# Patient Record
Sex: Female | Born: 1980 | Race: White | Hispanic: No | Marital: Married | Smoking: Never smoker
Health system: Southern US, Community
[De-identification: ages and names within clinical notes are randomized; demographics above are authoritative.]

## PROBLEM LIST (undated history)

## (undated) DIAGNOSIS — F419 Anxiety disorder, unspecified: Secondary | ICD-10-CM

## (undated) HISTORY — DX: Anxiety disorder, unspecified: F41.9

---

## 2006-06-28 ENCOUNTER — Inpatient Hospital Stay (HOSPITAL_COMMUNITY): Admission: AD | Admit: 2006-06-28 | Discharge: 2006-06-30 | Payer: Self-pay | Admitting: Obstetrics & Gynecology

## 2006-07-01 ENCOUNTER — Encounter: Admission: RE | Admit: 2006-07-01 | Discharge: 2006-07-31 | Payer: Self-pay | Admitting: Obstetrics and Gynecology

## 2006-07-07 ENCOUNTER — Ambulatory Visit: Admission: RE | Admit: 2006-07-07 | Discharge: 2006-07-07 | Payer: Self-pay | Admitting: Obstetrics and Gynecology

## 2006-08-01 ENCOUNTER — Encounter: Admission: RE | Admit: 2006-08-01 | Discharge: 2006-08-31 | Payer: Self-pay | Admitting: Obstetrics and Gynecology

## 2006-09-01 ENCOUNTER — Encounter: Admission: RE | Admit: 2006-09-01 | Discharge: 2006-09-29 | Payer: Self-pay | Admitting: Obstetrics and Gynecology

## 2006-09-30 ENCOUNTER — Encounter: Admission: RE | Admit: 2006-09-30 | Discharge: 2006-10-30 | Payer: Self-pay | Admitting: Obstetrics and Gynecology

## 2006-10-31 ENCOUNTER — Encounter: Admission: RE | Admit: 2006-10-31 | Discharge: 2006-11-29 | Payer: Self-pay | Admitting: Obstetrics and Gynecology

## 2006-11-30 ENCOUNTER — Encounter: Admission: RE | Admit: 2006-11-30 | Discharge: 2006-12-22 | Payer: Self-pay | Admitting: Obstetrics and Gynecology

## 2007-02-06 LAB — CONVERTED CEMR LAB

## 2008-01-13 ENCOUNTER — Ambulatory Visit: Payer: Self-pay | Admitting: Internal Medicine

## 2008-01-13 DIAGNOSIS — J309 Allergic rhinitis, unspecified: Secondary | ICD-10-CM | POA: Insufficient documentation

## 2008-02-05 ENCOUNTER — Encounter: Payer: Self-pay | Admitting: Internal Medicine

## 2009-11-13 ENCOUNTER — Inpatient Hospital Stay (HOSPITAL_COMMUNITY): Admission: AD | Admit: 2009-11-13 | Discharge: 2009-11-13 | Payer: Self-pay | Admitting: Obstetrics and Gynecology

## 2009-12-08 ENCOUNTER — Inpatient Hospital Stay (HOSPITAL_COMMUNITY): Admission: AD | Admit: 2009-12-08 | Discharge: 2009-12-08 | Payer: Self-pay | Admitting: Obstetrics and Gynecology

## 2009-12-13 ENCOUNTER — Inpatient Hospital Stay (HOSPITAL_COMMUNITY): Admission: AD | Admit: 2009-12-13 | Discharge: 2009-12-15 | Payer: Self-pay | Admitting: Obstetrics and Gynecology

## 2010-09-24 LAB — CBC
Hemoglobin: 10.8 g/dL — ABNORMAL LOW (ref 12.0–15.0)
Hemoglobin: 12.5 g/dL (ref 12.0–15.0)
Platelets: 188 10*3/uL (ref 150–400)
RBC: 3.26 MIL/uL — ABNORMAL LOW (ref 3.87–5.11)
RDW: 12.8 % (ref 11.5–15.5)
RDW: 13 % (ref 11.5–15.5)
WBC: 14 10*3/uL — ABNORMAL HIGH (ref 4.0–10.5)

## 2010-09-24 LAB — URINALYSIS, ROUTINE W REFLEX MICROSCOPIC
Bilirubin Urine: NEGATIVE
Glucose, UA: NEGATIVE mg/dL
Hgb urine dipstick: NEGATIVE
Ketones, ur: NEGATIVE mg/dL
Nitrite: NEGATIVE
Protein, ur: NEGATIVE mg/dL
Specific Gravity, Urine: >1.03 — ABNORMAL HIGH (ref 1.005–1.030)
pH: 5.5 (ref 5.0–8.0)

## 2010-09-25 LAB — URINALYSIS, ROUTINE W REFLEX MICROSCOPIC
Ketones, ur: NEGATIVE mg/dL
Nitrite: NEGATIVE

## 2011-03-14 ENCOUNTER — Ambulatory Visit: Payer: Managed Care, Other (non HMO) | Admitting: Internal Medicine

## 2011-04-19 DIAGNOSIS — F411 Generalized anxiety disorder: Secondary | ICD-10-CM | POA: Insufficient documentation

## 2011-08-08 IMAGING — US US FETAL BPP W/O NONSTRESS
1 series · 5 of 5 positions shown · non-contrast
Comparison: none

OBSTETRICAL ULTRASOUND:
 This ultrasound exam was performed in the [HOSPITAL] Ultrasound Department.  The OB US report was generated in the AS system, and faxed to the ordering physician.  This report is also available in [HOSPITAL]?s Access[employer] and in [REDACTED] PACS.

[Series 1: us fetal bpp w/o nonstress · non-contrast · 0.27mm/px · 5 acquisitions, 5 frames shown]
[im 1/5]
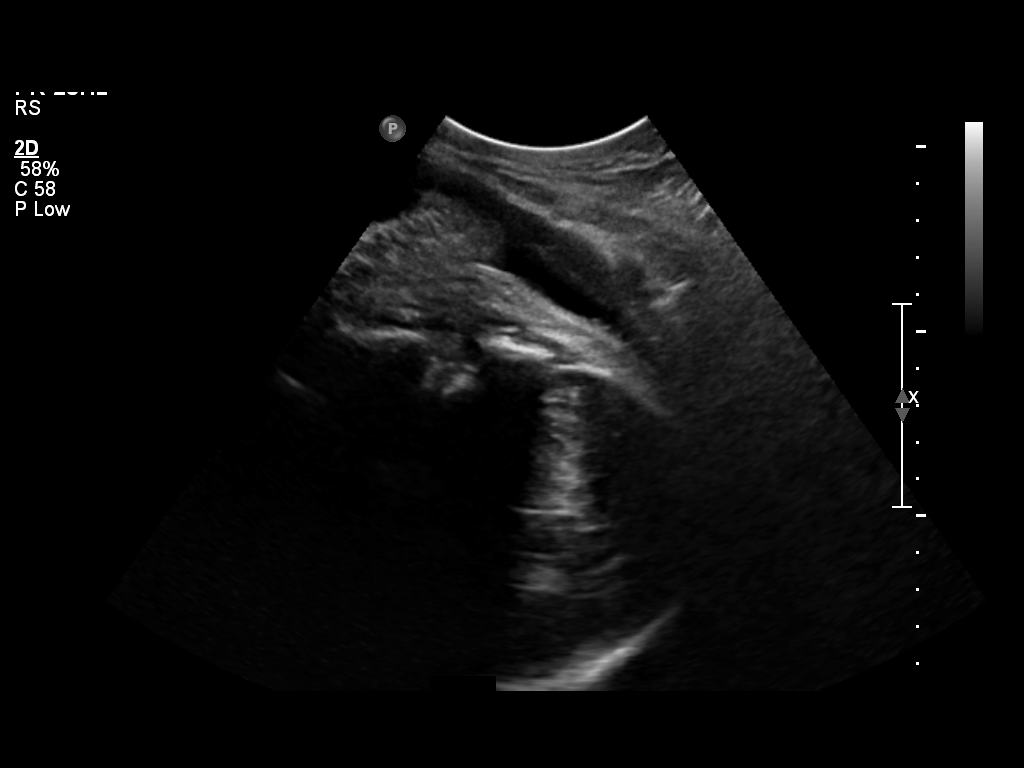
[im 2/5]
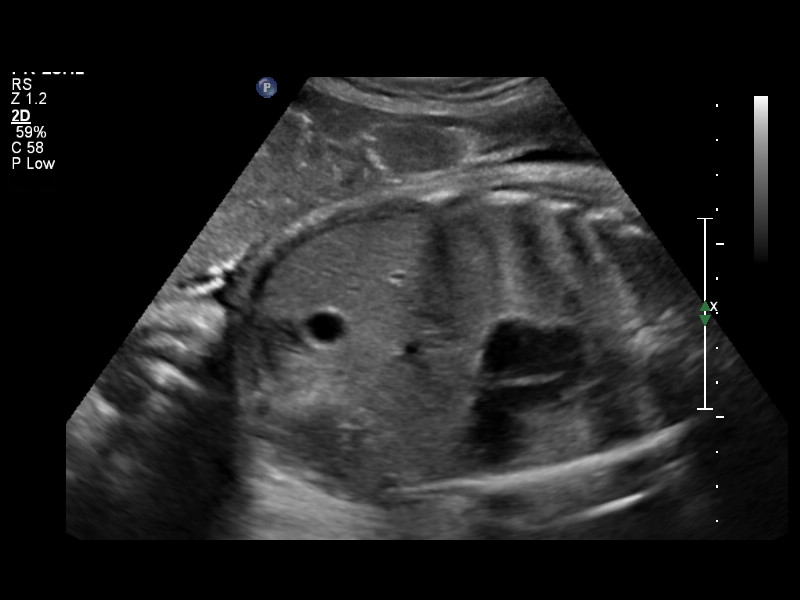
[im 3/5]
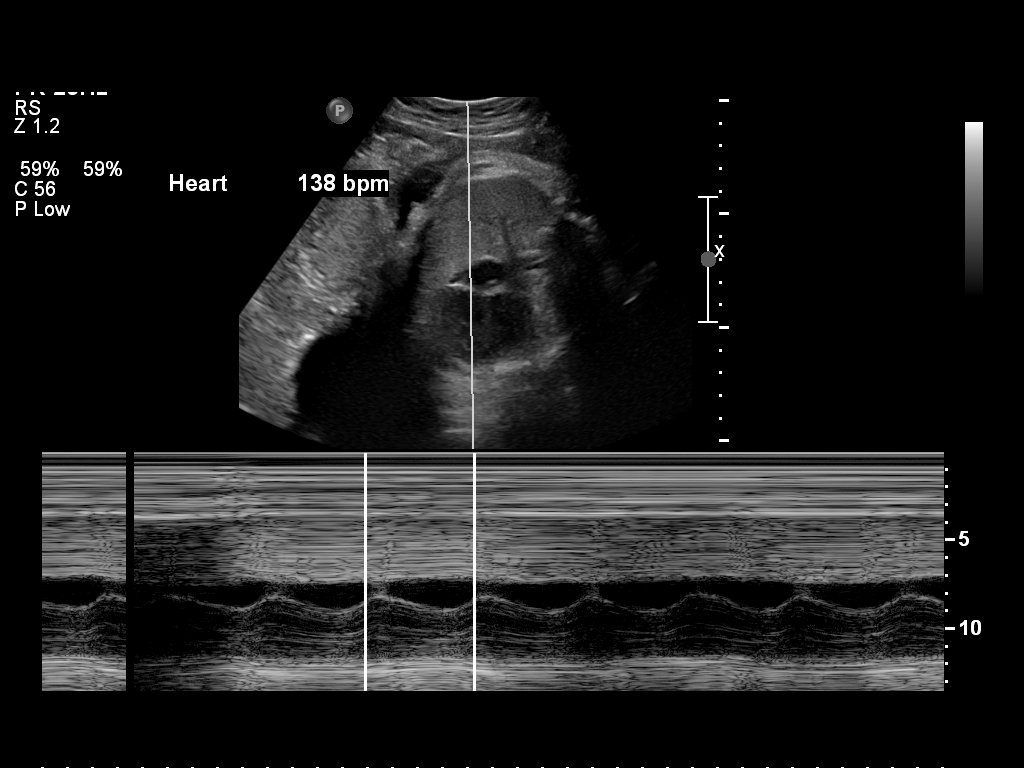
[im 4/5]
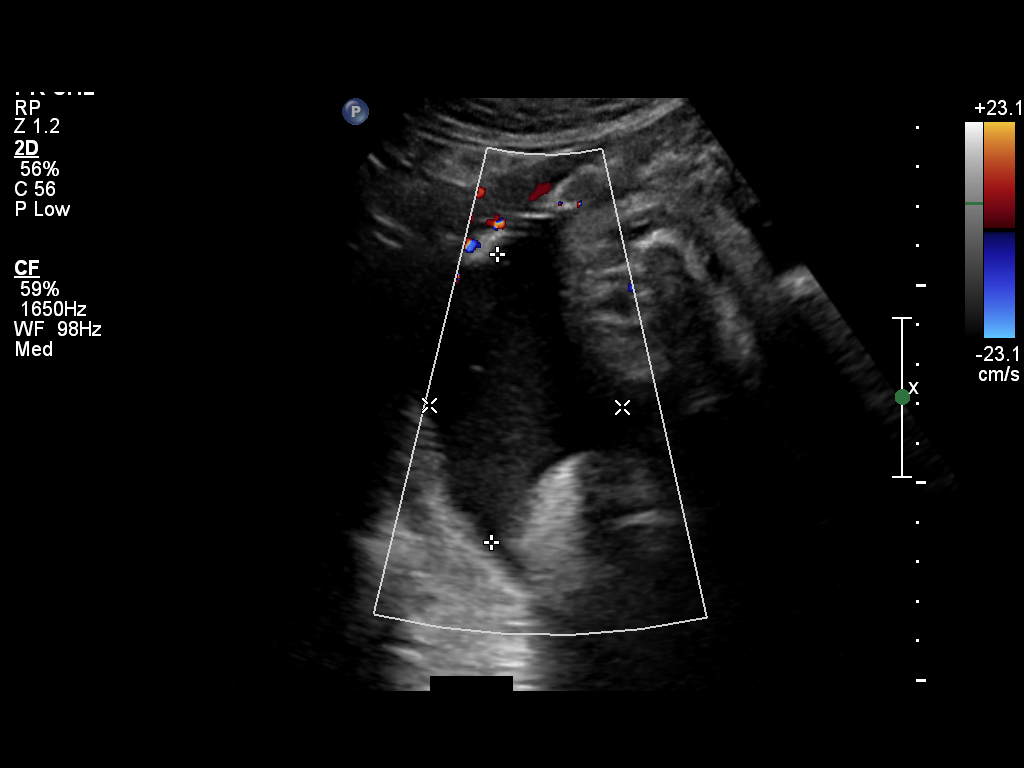
[im 5/5]
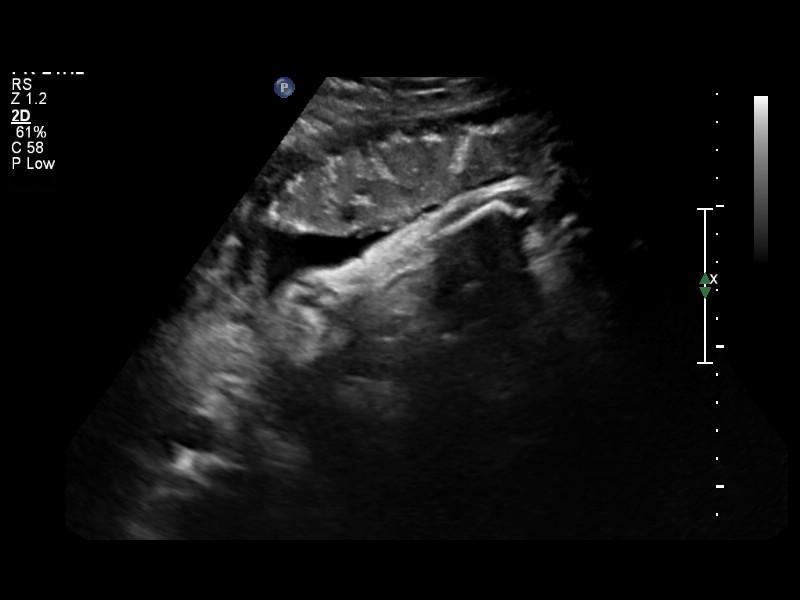

[5 of 5 positions shown; findings below may reference images not displayed]

IMPRESSION: See [facility] Obstetric US report.

## 2013-06-14 ENCOUNTER — Ambulatory Visit (INDEPENDENT_AMBULATORY_CARE_PROVIDER_SITE_OTHER): Payer: Managed Care, Other (non HMO) | Admitting: Obstetrics and Gynecology

## 2013-06-14 ENCOUNTER — Encounter: Payer: Self-pay | Admitting: Obstetrics and Gynecology

## 2013-06-14 VITALS — BP 120/74 | HR 80 | Resp 16 | Ht 67.0 in | Wt 152.5 lb

## 2013-06-14 DIAGNOSIS — Z0000 Encounter for general adult medical examination without abnormal findings: Secondary | ICD-10-CM

## 2013-06-14 DIAGNOSIS — Z23 Encounter for immunization: Secondary | ICD-10-CM

## 2013-06-14 DIAGNOSIS — Z01419 Encounter for gynecological examination (general) (routine) without abnormal findings: Secondary | ICD-10-CM

## 2013-06-14 LAB — POCT URINALYSIS DIPSTICK
Blood, UA: NEGATIVE
Glucose, UA: NEGATIVE
Leukocytes, UA: NEGATIVE
Nitrite, UA: NEGATIVE
Protein, UA: NEGATIVE
pH, UA: 5

## 2013-06-14 LAB — HEMOGLOBIN, FINGERSTICK: Hemoglobin, fingerstick: 14.7 g/dL (ref 12.0–16.0)

## 2013-06-14 NOTE — Patient Instructions (Signed)
EXERCISE AND DIET:  We recommended that you start or continue a regular exercise program for good health. Regular exercise means any activity that makes your heart beat faster and makes you sweat.  We recommend exercising at least 30 minutes per day at least 3 days a week, preferably 4 or 5.  We also recommend a diet low in fat and sugar.  Inactivity, poor dietary choices and obesity can cause diabetes, heart attack, stroke, and kidney damage, among others.    ALCOHOL AND SMOKING:  Women should limit their alcohol intake to no more than 7 drinks/beers/glasses of wine (combined, not each!) per week. Moderation of alcohol intake to this level decreases your risk of breast cancer and liver damage. And of course, no recreational drugs are part of a healthy lifestyle.  And absolutely no smoking or even second hand smoke. Most people know smoking can cause heart and lung diseases, but did you know it also contributes to weakening of your bones? Aging of your skin?  Yellowing of your teeth and nails?  CALCIUM AND VITAMIN D:  Adequate intake of calcium and Vitamin D are recommended.  The recommendations for exact amounts of these supplements seem to change often, but generally speaking 600 mg of calcium (either carbonate or citrate) and 800 units of Vitamin D per day seems prudent. Certain women may benefit from higher intake of Vitamin D.  If you are among these women, your doctor will have told you during your visit.    PAP SMEARS:  Pap smears, to check for cervical cancer or precancers,  have traditionally been done yearly, although recent scientific advances have shown that most women can have pap smears less often.  However, every woman still should have a physical exam from her gynecologist every year. It will include a breast check, inspection of the vulva and vagina to check for abnormal growths or skin changes, a visual exam of the cervix, and then an exam to evaluate the size and shape of the uterus and  ovaries.  And after 30 years of age, a rectal exam is indicated to check for rectal cancers. We will also provide age appropriate advice regarding health maintenance, like when you should have certain vaccines, screening for sexually transmitted diseases, bone density testing, colonoscopy, mammograms, etc.   MAMMOGRAMS:  All women over 40 years old should have a yearly mammogram. Many facilities now offer a "3D" mammogram, which may cost around $50 extra out of pocket. If possible,  we recommend you accept the option to have the 3D mammogram performed.  It both reduces the number of women who will be called back for extra views which then turn out to be normal, and it is better than the routine mammogram at detecting truly abnormal areas.    COLONOSCOPY:  Colonoscopy to screen for colon cancer is recommended for all women at age 50.  We know, you hate the idea of the prep.  We agree, BUT, having colon cancer and not knowing it is worse!!  Colon cancer so often starts as a polyp that can be seen and removed at colonscopy, which can quite literally save your life!  And if your first colonoscopy is normal and you have no family history of colon cancer, most women don't have to have it again for 10 years.  Once every ten years, you can do something that may end up saving your life, right?  We will be happy to help you get it scheduled when you are ready.    Be sure to check your insurance coverage so you understand how much it will cost.  It may be covered as a preventative service at no cost, but you should check your particular policy.     Tetanus, Diphtheria, Pertussis (Tdap) Vaccine What You Need to Know WHY GET VACCINATED? Tetanus, diphtheria and pertussis can be very serious diseases, even for adolescents and adults. Tdap vaccine can protect us from these diseases. TETANUS (Lockjaw) causes painful muscle tightening and stiffness, usually all over the body.  It can lead to tightening of muscles in the  head and neck so you can't open your mouth, swallow, or sometimes even breathe. Tetanus kills about 1 out of 5 people who are infected. DIPHTHERIA can cause a thick coating to form in the back of the throat.  It can lead to breathing problems, paralysis, heart failure, and death. PERTUSSIS (Whooping Cough) causes severe coughing spells, which can cause difficulty breathing, vomiting and disturbed sleep.  It can also lead to weight loss, incontinence, and rib fractures. Up to 2 in 100 adolescents and 5 in 100 adults with pertussis are hospitalized or have complications, which could include pneumonia and death. These diseases are caused by bacteria. Diphtheria and pertussis are spread from person to person through coughing or sneezing. Tetanus enters the body through cuts, scratches, or wounds. Before vaccines, the [place] saw as many as 200,000 cases a year of diphtheria and pertussis, and hundreds of cases of tetanus. Since vaccination began, tetanus and diphtheria have dropped by about 99% and pertussis by about 80%. TDAP VACCINE Tdap vaccine can protect adolescents and adults from tetanus, diphtheria, and pertussis. One dose of Tdap is routinely given at age 11 or 12. People who did not get Tdap at that age should get it as soon as possible. Tdap is especially important for health care professionals and anyone having close contact with a baby younger than 12 months. Pregnant women should get a dose of Tdap during every pregnancy, to protect the newborn from pertussis. Infants are most at risk for severe, life-threatening complications from pertussis. A similar vaccine, called Td, protects from tetanus and diphtheria, but not pertussis. A Td booster should be given every 10 years. Tdap may be given as one of these boosters if you have not already gotten a dose. Tdap may also be given after a severe cut or burn to prevent tetanus infection. Your doctor can give you more information. Tdap may  safely be given at the same time as other vaccines. SOME PEOPLE SHOULD NOT GET THIS VACCINE  If you ever had a life-threatening allergic reaction after a dose of any tetanus, diphtheria, or pertussis containing vaccine, OR if you have a severe allergy to any part of this vaccine, you should not get Tdap. Tell your doctor if you have any severe allergies.  If you had a coma, or long or multiple seizures within 7 days after a childhood dose of DTP or DTaP, you should not get Tdap, unless a cause other than the vaccine was found. You can still get Td.  Talk to your doctor if you:  have epilepsy or another nervous system problem,  had severe pain or swelling after any vaccine containing diphtheria, tetanus or pertussis,  ever had Guillain-Barr Syndrome (GBS),  aren't feeling well on the day the shot is scheduled. RISKS OF A VACCINE REACTION With any medicine, including vaccines, there is a chance of side effects. These are usually mild and go away on their own, but   serious reactions are also possible. Brief fainting spells can follow a vaccination, leading to injuries from falling. Sitting or lying down for about 15 minutes can help prevent these. Tell your doctor if you feel dizzy or light-headed, or have vision changes or ringing in the ears. Mild problems following Tdap (Did not interfere with activities)  Pain where the shot was given (about 3 in 4 adolescents or 2 in 3 adults)  Redness or swelling where the shot was given (about 1 person in 5)  Mild fever of at least 100.4F (up to about 1 in 25 adolescents or 1 in 100 adults)  Headache (about 3 or 4 people in 10)  Tiredness (about 1 person in 3 or 4)  Nausea, vomiting, diarrhea, stomach ache (up to 1 in 4 adolescents or 1 in 10 adults)  Chills, body aches, sore joints, rash, swollen glands (uncommon) Moderate problems following Tdap (Interfered with activities, but did not require medical attention)  Pain where the shot was  given (about 1 in 5 adolescents or 1 in 100 adults)  Redness or swelling where the shot was given (up to about 1 in 16 adolescents or 1 in 25 adults)  Fever over 102F (about 1 in 100 adolescents or 1 in 250 adults)  Headache (about 3 in 20 adolescents or 1 in 10 adults)  Nausea, vomiting, diarrhea, stomach ache (up to 1 or 3 people in 100)  Swelling of the entire arm where the shot was given (up to about 3 in 100). Severe problems following Tdap (Unable to perform usual activities, required medical attention)  Swelling, severe pain, bleeding and redness in the arm where the shot was given (rare). A severe allergic reaction could occur after any vaccine (estimated less than 1 in a million doses). WHAT IF THERE IS A SERIOUS REACTION? What should I look for?  Look for anything that concerns you, such as signs of a severe allergic reaction, very high fever, or behavior changes. Signs of a severe allergic reaction can include hives, swelling of the face and throat, difficulty breathing, a fast heartbeat, dizziness, and weakness. These would start a few minutes to a few hours after the vaccination. What should I do?  If you think it is a severe allergic reaction or other emergency that can't wait, call 9-1-1 or get the person to the nearest hospital. Otherwise, call your doctor.  Afterward, the reaction should be reported to the "Vaccine Adverse Event Reporting System" (VAERS). Your doctor might file this report, or you can do it yourself through the VAERS web site at www.vaers.hhs.gov, or by calling 1-800-822-7967. VAERS is only for reporting reactions. They do not give medical advice.  THE NATIONAL VACCINE INJURY COMPENSATION PROGRAM The National Vaccine Injury Compensation Program (VICP) is a federal program that was created to compensate people who may have been injured by certain vaccines. Persons who believe they may have been injured by a vaccine can learn about the program and about  filing a claim by calling 1-800-338-2382 or visiting the VICP website at www.hrsa.gov/vaccinecompensation. HOW CAN I LEARN MORE?  Ask your doctor.  Call your local or state health department.  Contact the Centers for Disease Control and Prevention (CDC):  Call 1-800-232-4636 or visit CDC's website at www.cdc.gov/vaccines. CDC Tdap Vaccine VIS (11/14/11) Document Released: 12/24/2011 Document Revised: 10/19/2012 Document Reviewed: 10/14/2012 Exit[employer] Patient Information 2014 [employer].  

## 2013-06-14 NOTE — Progress Notes (Signed)
Patient ID: Victoria Nash, female   DOB: [birth year], 32 y.o.   MRN: [id number] GYNECOLOGY VISIT  PCP:   None  Referring provider:   HPI: 32 y.o.   Married  Caucasian  female   G2P2002 with Patient's last menstrual period was 05/31/2013.   here for   AEX. Normal menses.  Hgb:    14.7 Urine:  Neg  GYNECOLOGIC HISTORY: Patient's last menstrual period was 05/31/2013. Sexually active:  yes Partner preference: female Contraception:   vasectomy Menopausal hormone therapy: n/a DES exposure:   no Blood transfusions:   no Sexually transmitted diseases:  no  GYN Procedures:  none Mammogram:   n/a              Pap:   05/2012 wnl History of abnormal pap smear:  no   OB History   Grav Para Term Preterm Abortions TAB SAB Ect Mult Living   2 2 2       2        LIFESTYLE: Exercise:    no           Tobacco:   no Alcohol:      2-4 glasses of wine per week Drug use:   no  OTHER HEALTH MAINTENANCE: Tetanus/TDap:   10 years ago or greater Gardisil:               no Influenza:             Not usually Zostavax:             no  Bone density:      n/a Colonoscopy:      n/a  Cholesterol check:  06/2012 wnl  Family History  Problem Relation Age of Onset  . Hypertension Father     Patient Active Problem List   Diagnosis Date Noted  . ALLERGIC RHINITIS 01/13/2008   History reviewed. No pertinent past medical history.  History reviewed. No pertinent past surgical history.  ALLERGIES: Review of patient's allergies indicates no known allergies.  No current outpatient prescriptions on file.   No current facility-administered medications for this visit.     ROS:  Pertinent items are noted in HPI.  SOCIAL HISTORY:  Works in Occupational psychologist.  Children 67 and 30 years old.   PHYSICAL EXAMINATION:    BP 120/74  Pulse 80  Resp 16  Ht 5\' 7"  (1.702 m)  Wt 152 lb 8 oz (69.174 kg)  BMI 23.88 kg/m2  LMP 05/31/2013   Wt Readings from Last 3 Encounters:  06/14/13 152 lb 8 oz (69.174  kg)  01/13/08 139 lb 8 oz (63.277 kg)     Ht Readings from Last 3 Encounters:  06/14/13 5\' 7"  (1.702 m)  01/13/08 5\' 7"  (1.702 m)    General appearance: alert, cooperative and appears stated age Head: Normocephalic, without obvious abnormality, atraumatic Neck: no adenopathy, supple, symmetrical, trachea midline and thyroid not enlarged, symmetric, no tenderness/mass/nodules Lungs: clear to auscultation bilaterally Breasts: Inspection negative, No nipple retraction or dimpling, No nipple discharge or bleeding, No axillary or supraclavicular adenopathy, Normal to palpation without dominant masses Heart: regular rate and rhythm Abdomen: soft, non-tender; no masses,  no organomegaly Extremities: extremities normal, atraumatic, no cyanosis or edema Skin: Skin color, texture, turgor normal. No rashes or lesions Lymph nodes: Cervical, supraclavicular, and axillary nodes normal. No abnormal inguinal nodes palpated Neurologic: Grossly normal  Pelvic: External genitalia:  no lesions  Urethra:  normal appearing urethra with no masses, tenderness or lesions              Bartholins and Skenes: normal                 Vagina: normal appearing vagina with normal color and discharge, no lesions              Cervix: normal appearance              Pap and high risk HPV testing done: yes.            Bimanual Exam:  Uterus:  uterus is normal size, shape, consistency and nontender                                      Adnexa: normal adnexa in size, nontender and no masses                                        ASSESSMENT  Normal gynecologic exam.  PLAN  Mammogram age 73 years old.  Pap smear and high risk HPV testing performed.  Counseled on vitamins, calcium TDap today. Flu vaccine at local pharmacy.  Return annually or prn   An After Visit Summary was printed and given to the patient.

## 2013-06-16 LAB — IPS PAP TEST WITH HPV

## 2014-05-09 ENCOUNTER — Encounter: Payer: Self-pay | Admitting: Obstetrics and Gynecology

## 2014-06-16 ENCOUNTER — Ambulatory Visit: Payer: Managed Care, Other (non HMO) | Admitting: Obstetrics and Gynecology

## 2014-06-16 ENCOUNTER — Telehealth: Payer: Self-pay | Admitting: Obstetrics and Gynecology

## 2014-06-16 NOTE — Telephone Encounter (Signed)
Patient called to cancel her AEX today with Dr.Silva due to her child care "falling through." No missed appointment fee charged (confirmed with Sue LushAndrea). Patient rescheduled with Leota Sauerseborah Leonard, CNM for 06/20/14. FYI only.

## 2014-06-16 NOTE — Telephone Encounter (Signed)
Thank you for the update!

## 2014-06-20 ENCOUNTER — Ambulatory Visit (INDEPENDENT_AMBULATORY_CARE_PROVIDER_SITE_OTHER): Payer: Managed Care, Other (non HMO) | Admitting: Certified Nurse Midwife

## 2014-06-20 ENCOUNTER — Encounter: Payer: Self-pay | Admitting: Certified Nurse Midwife

## 2014-06-20 VITALS — BP 120/80 | HR 72 | Resp 16 | Ht 66.75 in | Wt 139.0 lb

## 2014-06-20 DIAGNOSIS — Z0000 Encounter for general adult medical examination without abnormal findings: Secondary | ICD-10-CM

## 2014-06-20 DIAGNOSIS — Z01419 Encounter for gynecological examination (general) (routine) without abnormal findings: Secondary | ICD-10-CM

## 2014-06-20 LAB — POCT URINALYSIS DIPSTICK
Bilirubin, UA: NEGATIVE
Blood, UA: NEGATIVE
Glucose, UA: NEGATIVE
KETONES UA: NEGATIVE
LEUKOCYTES UA: NEGATIVE
Nitrite, UA: NEGATIVE
PROTEIN UA: NEGATIVE
UROBILINOGEN UA: NEGATIVE
pH, UA: 5

## 2014-06-20 LAB — HEMOGLOBIN, FINGERSTICK: Hemoglobin, fingerstick: 13.7 g/dL (ref 12.0–16.0)

## 2014-06-20 NOTE — Patient Instructions (Signed)

## 2014-06-20 NOTE — Progress Notes (Signed)
33 y.o. 562P2002 Married Caucasian Fe here for annual exam.  Periods normal, no issues. Sees urgent care if needed. Has been exercising  To lose weight with 13 pound weight loss. No health issues today.  Patient's last menstrual period was 05/27/2014.          Sexually active: Yes.    The current method of family planning is vasectomy.    Exercising: Yes.    cycle, strength training Smoker:  no  Health Maintenance: Pap:  06-14-13 neg HPV HR neg No abnormal pap history MMG:  none Colonoscopy:  none BMD:   none TDaP: 2014 Labs: Poct urine-neg Self breast exam: done monthly   reports that she has never smoked. She does not have any smokeless tobacco history on file. She reports that she drinks about 1.8 - 2.4 oz of alcohol per week. She reports that she does not use illicit drugs.  History reviewed. No pertinent past medical history.  History reviewed. No pertinent past surgical history.  No current outpatient prescriptions on file.   No current facility-administered medications for this visit.    Family History  Problem Relation Age of Onset  . Hypertension Father     ROS:  Pertinent items are noted in HPI.  Otherwise, a comprehensive ROS was negative.  Exam:   BP 120/80 mmHg  Pulse 72  Resp 16  Ht 5' 6.75" (1.695 m)  Wt 139 lb (63.05 kg)  BMI 21.95 kg/m2  LMP 05/27/2014 Height: 5' 6.75" (169.5 cm)  Ht Readings from Last 3 Encounters:  06/20/14 5' 6.75" (1.695 m)  06/14/13 5\' 7"  (1.702 m)  01/13/08 5\' 7"  (1.702 m)    General appearance: alert, cooperative and appears stated age Head: Normocephalic, without obvious abnormality, atraumatic Neck: no adenopathy, supple, symmetrical, trachea midline and thyroid normal to inspection and palpation Lungs: clear to auscultation bilaterally Breasts: normal appearance, no masses or tenderness, No nipple retraction or dimpling, No nipple discharge or bleeding Heart: regular rate and rhythm Abdomen: soft, non-tender; no masses,   no organomegaly Extremities: extremities normal, atraumatic, no cyanosis or edema Skin: Skin color, texture, turgor normal. No rashes or lesions Lymph nodes: Cervical, supraclavicular, and axillary nodes normal. No abnormal inguinal nodes palpated Neurologic: Grossly normal   Pelvic: External genitalia:  no lesions              Urethra:  normal appearing urethra with no masses, tenderness or lesions              Bartholin's and Skene's: normal                 Vagina: normal appearing vagina with normal color and discharge, no lesions              Cervix: normal, non tender, no lesions              Pap taken: No. Bimanual Exam:  Uterus:  normal size, contour, position, consistency, mobility, non-tender and anteverted              Adnexa: normal adnexa and no mass, fullness, tenderness               Rectovaginal: Confirms               Anus:  normal sphincter tone, no lesions  A:  Well Woman with normal exam  Screening labs  P:   Reviewed health and wellness pertinent to exam  Lab: Lipid panel  Pap smear not taken today  counseled on breast self exam, adequate intake of calcium and vitamin D, diet and exercise  return annually or prn  An After Visit Summary was printed and given to the patient.

## 2014-06-21 LAB — LIPID PANEL
CHOLESTEROL: 154 mg/dL (ref 0–200)
HDL: 76 mg/dL (ref >39)
LDL CALC: 69 mg/dL (ref 0–99)
Total CHOL/HDL Ratio: 2 Ratio
Triglycerides: 45 mg/dL (ref <150)
VLDL: 9 mg/dL (ref 0–40)

## 2014-06-22 NOTE — Progress Notes (Signed)
Reviewed personally.  M. Suzanne Scottlyn Mchaney, MD.  

## 2015-02-22 ENCOUNTER — Encounter: Payer: Self-pay | Admitting: Podiatry

## 2015-02-22 ENCOUNTER — Ambulatory Visit (INDEPENDENT_AMBULATORY_CARE_PROVIDER_SITE_OTHER): Payer: 59 | Admitting: Podiatry

## 2015-02-22 VITALS — BP 114/78 | HR 80 | Resp 16

## 2015-02-22 DIAGNOSIS — B351 Tinea unguium: Secondary | ICD-10-CM

## 2015-02-22 MED ORDER — TERBINAFINE HCL 250 MG PO TABS: 250.0000 mg | ORAL_TABLET | Freq: Every day | ORAL | Status: DC

## 2015-02-22 NOTE — Progress Notes (Signed)
Subjective:     Patient ID: Victoria Nash, female   DOB: June 20, 1981, 34 y.o.   MRN: [id number]  HPI patient presents with discoloration in thickness of the big toenails and fifth toenails of both feet. States that she's tried medicine and it's been present for at least a year   Review of Systems  All other systems reviewed and are negative.      Objective:   Physical Exam  Constitutional: She is oriented to person, place, and time.  Cardiovascular: Intact distal pulses.   Musculoskeletal: Normal range of motion.  Neurological: She is oriented to person, place, and time.  Skin: Skin is warm.  Nursing note and vitals reviewed.  neurovascular status is intact muscle strength was adequate with range of motion subtalar midtarsal joint within normal limits. 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 At this point I have recommended a combined approach due to for nail involvement consisting of oral Lamisil No. 91 per day for 3 months and also topical and laser. Educated her on this treatment plan and she will get started in the next several weeks and we will get liver function studies done prior to Lamisil initiation

## 2015-02-22 NOTE — Progress Notes (Signed)
   Subjective:    Patient ID: Victoria Nash, female    DOB: 1980-08-29, 34 y.o.   MRN: [id number]  HPI  Pt presents with bilateral discolored nails  Review of Systems  All other systems reviewed and are negative.      Objective:   Physical Exam        Assessment & Plan:

## 2015-02-24 ENCOUNTER — Telehealth: Payer: Self-pay | Admitting: *Deleted

## 2015-02-24 NOTE — Telephone Encounter (Addendum)
Faxed Hepatic function and CBC from Dr. Maryelizabeth Rowan [facility] received for Dr. Charlsie Merles to evaluate for Lamisil treatment.  Dr. Charlsie Merles states labs are within normal limits and pt can take the Lamisil as directed.  I left a message informing pt that Dr. Charlsie Merles stated she could take her medication as directed.  See scanned labs dated 02/22/2105.

## 2015-02-27 DIAGNOSIS — M79673 Pain in unspecified foot: Secondary | ICD-10-CM

## 2015-03-02 ENCOUNTER — Ambulatory Visit: Payer: 59 | Admitting: Podiatry

## 2015-07-11 ENCOUNTER — Ambulatory Visit: Payer: Managed Care, Other (non HMO) | Admitting: Certified Nurse Midwife

## 2015-07-19 ENCOUNTER — Ambulatory Visit (INDEPENDENT_AMBULATORY_CARE_PROVIDER_SITE_OTHER): Payer: 59 | Admitting: Certified Nurse Midwife

## 2015-07-19 ENCOUNTER — Encounter: Payer: Self-pay | Admitting: Certified Nurse Midwife

## 2015-07-19 VITALS — BP 106/64 | HR 72 | Resp 16 | Ht 66.25 in | Wt 156.0 lb

## 2015-07-19 DIAGNOSIS — Z124 Encounter for screening for malignant neoplasm of cervix: Secondary | ICD-10-CM | POA: Diagnosis not present

## 2015-07-19 DIAGNOSIS — Z01419 Encounter for gynecological examination (general) (routine) without abnormal findings: Secondary | ICD-10-CM

## 2015-07-19 NOTE — Progress Notes (Signed)
35 y.o. G18P2002 Married  Caucasian Fe here for annual exam.  Periods normal,no issues. Patient aware she has changed weight due to decrease in cycling. Plans to start back with routine exercise soon. Sees PCP Dr. Duanne Guess yearly for aex/labs, all normal per patient. No other health issues today.  Patient's last menstrual period was 07/10/2015.          Sexually active: Yes.    The current method of family planning is vasectomy.    Exercising: Yes.    cycle & strength Smoker:  no  Health Maintenance: Pap:  06-14-13 neg HPV HR neg MMG:  none Colonoscopy:  none BMD:   none TDaP:  2014 Shingles: no Pneumonia: no Hep C and HIV: never done  Labs: none Self breast exam: done monthly   reports that she has never smoked. She does not have any smokeless tobacco history on file. She reports that she drinks about 2.4 oz of alcohol per week. She reports that she does not use illicit drugs.  History reviewed. No pertinent past medical history.  History reviewed. No pertinent past surgical history.  No current outpatient prescriptions on file.   No current facility-administered medications for this visit.    Family History  Problem Relation Age of Onset  . Hypertension Father   . Cancer Father     oral cancer  . Lung cancer Maternal Grandmother     ROS:  Pertinent items are noted in HPI.  Otherwise, a comprehensive ROS was negative.  Exam:   BP 106/64 mmHg  Pulse 72  Resp 16  Ht 5' 6.25" (1.683 m)  Wt 156 lb (70.761 kg)  BMI 24.98 kg/m2  LMP 07/10/2015 Height: 5' 6.25" (168.3 cm) Ht Readings from Last 3 Encounters:  07/19/15 5' 6.25" (1.683 m)  06/20/14 5' 6.75" (1.695 m)  06/14/13 5\' 7"  (1.702 m)    General appearance: alert, cooperative and appears stated age Head: Normocephalic, without obvious abnormality, atraumatic Neck: no adenopathy, supple, symmetrical, trachea midline and thyroid normal to inspection and palpation Lungs: clear to auscultation bilaterally Breasts:  normal appearance, no masses or tenderness, No nipple retraction or dimpling, No nipple discharge or bleeding, No axillary or supraclavicular adenopathy Heart: regular rate and rhythm Abdomen: soft, non-tender; no masses,  no organomegaly Extremities: extremities normal, atraumatic, no cyanosis or edema Skin: Skin color, texture, turgor normal. No rashes or lesions Lymph nodes: Cervical, supraclavicular, and axillary nodes normal. No abnormal inguinal nodes palpated Neurologic: Grossly normal   Pelvic: External genitalia:  no lesions              Urethra:  normal appearing urethra with no masses, tenderness or lesions              Bartholin's and Skene's: normal                 Vagina: normal appearing vagina with normal color and discharge, no lesions              Cervix: normal,non tender,no lesions              Pap taken: Yes.   Bimanual Exam:  Uterus:  normal size, contour, position, consistency, mobility, non-tender              Adnexa: normal adnexa and no mass, fullness, tenderness               Rectovaginal: Confirms               Anus:  normal sphincter tone, no lesions  Chaperone present: yes  A:  Well Woman with normal exam  Contraception spouse vascetomy  P:   Reviewed health and wellness pertinent to exam  Encouraged to have HIV and Hep. C done with routine labs with PCP  Pap smear as above with HPV reflex   counseled on breast self exam, adequate intake of calcium and vitamin D, diet and exercise  return annually or prn  An After Visit Summary was printed and given to the patient.

## 2015-07-19 NOTE — Patient Instructions (Signed)

## 2015-07-20 NOTE — Progress Notes (Signed)
Reviewed personally.  M. Suzanne Maleiyah Releford, MD.  

## 2015-07-21 LAB — IPS PAP TEST WITH REFLEX TO HPV

## 2016-07-30 ENCOUNTER — Ambulatory Visit: Payer: 59 | Admitting: Certified Nurse Midwife

## 2016-12-19 ENCOUNTER — Encounter: Payer: Self-pay | Admitting: Certified Nurse Midwife

## 2016-12-19 ENCOUNTER — Ambulatory Visit (INDEPENDENT_AMBULATORY_CARE_PROVIDER_SITE_OTHER): Payer: 59 | Admitting: Certified Nurse Midwife

## 2016-12-19 VITALS — BP 120/80 | HR 70 | Resp 16 | Ht 66.25 in | Wt 152.0 lb

## 2016-12-19 DIAGNOSIS — Z01419 Encounter for gynecological examination (general) (routine) without abnormal findings: Secondary | ICD-10-CM | POA: Diagnosis not present

## 2016-12-19 NOTE — Progress Notes (Signed)
36 y.o. 242P2002 Married  Caucasian Fe here for annual exam. Periods regular no change. No missed periods. Father passed last year from Leukemia. Emotionally doing ok. Seeing dermatology again for cystic adult acne and treating . Was better with OCP use.Considering use for acne, does not need for contraception. Sees PCP for aex and labs. No health issues today.  Patient's last menstrual period was 11/24/2016 (exact date).          Sexually active: Yes.    The current method of family planning is vasectomy.    Exercising: Yes.    run,cycle, weights Smoker:  no  Health Maintenance: Pap:  06-14-13 neg HPV HR neg, 07-19-15 neg History of Abnormal Pap: no MMG:  none Self Breast exams: yes Colonoscopy:  none BMD:   none TDaP:  2014 Shingles: no Pneumonia: no Hep C and HIV: not done unless during pregnancy Labs: none Self breast exam:   reports that she has never smoked. She has never used smokeless tobacco. She reports that she drinks about 3.0 oz of alcohol per week . She reports that she does not use drugs.  History reviewed. No pertinent past medical history.  History reviewed. No pertinent surgical history.  No current outpatient prescriptions on file.   No current facility-administered medications for this visit.     Family History  Problem Relation Age of Onset  . Hypertension Father   . Cancer Father        oral cancer  . Leukemia Father        lymphocytic  . Dementia Father   . Lung cancer Maternal Grandmother     ROS:  Pertinent items are noted in HPI.  Otherwise, a comprehensive ROS was negative.  Exam:   BP 120/80   Pulse 70   Resp 16   Ht 5' 6.25" (1.683 m)   Wt 152 lb (68.9 kg)   LMP 11/24/2016 (Exact Date)   BMI 24.35 kg/m  Height: 5' 6.25" (168.3 cm) Ht Readings from Last 3 Encounters:  12/19/16 5' 6.25" (1.683 m)  07/19/15 5' 6.25" (1.683 m)  06/20/14 5' 6.75" (1.695 m)    General appearance: alert, cooperative and appears stated age Head:  Normocephalic, without obvious abnormality, atraumatic Neck: no adenopathy, supple, symmetrical, trachea midline and thyroid normal to inspection and palpation Lungs: clear to auscultation bilaterally Breasts: normal appearance, no masses or tenderness, No nipple retraction or dimpling, No nipple discharge or bleeding, No axillary or supraclavicular adenopathy Heart: regular rate and rhythm Abdomen: soft, non-tender; no masses,  no organomegaly Extremities: extremities normal, atraumatic, no cyanosis or edema Skin: Skin color, texture, turgor normal. No rashes or lesions Lymph nodes: Cervical, supraclavicular, and axillary nodes normal. No abnormal inguinal nodes palpated Neurologic: Grossly normal   Pelvic: External genitalia:  no lesions              Urethra:  normal appearing urethra with no masses, tenderness or lesions              Bartholin's and Skene's: normal                 Vagina: normal appearing vagina with normal color and discharge, no lesions              Cervix: no cervical motion tenderness, no lesions and normal appearance              Pap taken: No. Bimanual Exam:  Uterus:  normal size, contour, position, consistency, mobility, non-tender  Adnexa: normal adnexa and no mass, fullness, tenderness               Rectovaginal: Confirms               Anus:  normal sphincter tone, no lesions  Chaperone present: yes  A:  Well Woman with normal exam  Contraception  Spouse vasectomy  Cystic acne may consider OCP use again for management, will advise  P:   Reviewed health and wellness pertinent to exam  Discussed risk/benefit for use outside parameters and feel dermatology can management this, but please advise and will work with patient for safest option.  Pap smear: no   counseled on breast self exam, use and side effects of OCP's, adequate intake of calcium and vitamin D, diet and exercise   return annually or prn  An After Visit Summary was printed and  given to the patient.

## 2016-12-19 NOTE — Patient Instructions (Signed)
EXERCISE AND DIET:  We recommended that you start or continue a regular exercise program for good health. Regular exercise means any activity that makes your heart beat faster and makes you sweat.  We recommend exercising at least 30 minutes per day at least 3 days a week, preferably 4 or 5.  We also recommend a diet low in fat and sugar.  Inactivity, poor dietary choices and obesity can cause diabetes, heart attack, stroke, and kidney damage, among others.    ALCOHOL AND SMOKING:  Women should limit their alcohol intake to no more than 7 drinks/beers/glasses of wine (combined, not each!) per week. Moderation of alcohol intake to this level decreases your risk of breast cancer and liver damage. And of course, no recreational drugs are part of a healthy lifestyle.  And absolutely no smoking or even second hand smoke. Most people know smoking can cause heart and lung diseases, but did you know it also contributes to weakening of your bones? Aging of your skin?  Yellowing of your teeth and nails?  CALCIUM AND VITAMIN D:  Adequate intake of calcium and Vitamin D are recommended.  The recommendations for exact amounts of these supplements seem to change often, but generally speaking 600 mg of calcium (either carbonate or citrate) and 800 units of Vitamin D per day seems prudent. Certain women may benefit from higher intake of Vitamin D.  If you are among these women, your doctor will have told you during your visit.    PAP SMEARS:  Pap smears, to check for cervical cancer or precancers,  have traditionally been done yearly, although recent scientific advances have shown that most women can have pap smears less often.  However, every woman still should have a physical exam from her gynecologist every year. It will include a breast check, inspection of the vulva and vagina to check for abnormal growths or skin changes, a visual exam of the cervix, and then an exam to evaluate the size and shape of the uterus and  ovaries.  And after 34 years of age, a rectal exam is indicated to check for rectal cancers. We will also provide age appropriate advice regarding health maintenance, like when you should have certain vaccines, screening for sexually transmitted diseases, bone density testing, colonoscopy, mammograms, etc.   MAMMOGRAMS:  All women over 40 years old should have a yearly mammogram. Many facilities now offer a "3D" mammogram, which may cost around $50 extra out of pocket. If possible,  we recommend you accept the option to have the 3D mammogram performed.  It both reduces the number of women who will be called back for extra views which then turn out to be normal, and it is better than the routine mammogram at detecting truly abnormal areas.    COLONOSCOPY:  Colonoscopy to screen for colon cancer is recommended for all women at age 50.  We know, you hate the idea of the prep.  We agree, BUT, having colon cancer and not knowing it is worse!!  Colon cancer so often starts as a polyp that can be seen and removed at colonscopy, which can quite literally save your life!  And if your first colonoscopy is normal and you have no family history of colon cancer, most women don't have to have it again for 10 years.  Once every ten years, you can do something that may end up saving your life, right?  We will be happy to help you get it scheduled when you are ready.    Be sure to check your insurance coverage so you understand how much it will cost.  It may be covered as a preventative service at no cost, but you should check your particular policy.      Acne Acne is a skin problem that causes small, red bumps (pimples). Acne happens when the tiny holes in your skin (pores) get blocked. Your pores may become red, sore, and swollen. They may also become infected. Acne is a common skin problem. It is especially common in teenagers. Acne usually goes away over time. Follow these instructions at home: Good skin care is the  most important thing you can do to treat your acne. Take care of your skin as told by your doctor. You may be told to do these things:  Wash your skin gently at least two times each day. You should also wash your skin: ? After you exercise. ? Before you go to bed.  Use mild soap.  Use a water-based skin moisturizer after you wash your skin.  Use a sunscreen or sunblock with SPF 30 or greater. This is very important if you are using acne medicines.  Choose cosmetics that will not plug your oil glands (are noncomedogenic).  Medicines  Take over-the-counter and prescription medicines only as told by your doctor.  If you were prescribed an antibiotic medicine, apply or take it as told by your doctor. Do not stop using the antibiotic even if your acne improves. General instructions  Keep your hair clean and off of your face. Shampoo your hair regularly. If you have oily hair, you may need to wash it every day.  Avoid leaning your chin or forehead on your hands.  Avoid wearing tight headbands or hats.  Avoid picking or squeezing your pimples. That can make your acne worse and cause scarring.  Keep all follow-up visits as told by your doctor. This is important.  Shave gently. Only shave when it is necessary.  Keep a food journal. This can help you to see if any foods are linked with your acne. Contact a doctor if:  Your acne is not better after eight weeks.  Your acne gets worse.  You have a large area of skin that is red or tender.  You think that you are having side effects from any acne medicine. This information is not intended to replace advice given to you by your health care provider. Make sure you discuss any questions you have with your health care provider. Document Released: 06/13/2011 Document Revised: 11/30/2015 Document Reviewed: 08/31/2014 Elsevier Interactive Patient Education  [employer]2018 [employer]

## 2017-11-13 ENCOUNTER — Telehealth: Payer: Self-pay | Admitting: Certified Nurse Midwife

## 2017-11-13 NOTE — Telephone Encounter (Signed)
Left message on voicemail to call and reschedule cancelled appointment. °

## 2017-12-23 ENCOUNTER — Ambulatory Visit: Payer: 59 | Admitting: Certified Nurse Midwife

## 2018-01-22 NOTE — Progress Notes (Signed)
37 y.o. 352P2002 Married  Caucasian Fe here for annual exam. Periods normal, no issues. Sees PCP prn, no labs in 3 years, would like screening labs today.Eating well, no weight change. No health issues today. 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  LMP: 01/13/18          Sexually active: Yes.    The current method of family planning is vasectomy.    Exercising: Yes.    cycling, weight training Smoker:  no  Health Maintenance:  Pap:  07-19-15 neg History of Abnormal Pap: no MMG:  none Self Breast exams: yes Colonoscopy:  none BMD:   none TDaP:  2014 Shingles: no Pneumonia: no Hep C and HIV: yes, donated blood Labs: discuss today   reports that she has never smoked. She has never used smokeless tobacco. She reports that she drinks about 3.0 oz of alcohol per week. She reports that she does not use drugs.  History reviewed. No pertinent past medical history.  History reviewed. No pertinent surgical history.  Current Outpatient Medications  Medication Sig Dispense Refill  . Calcium-Magnesium-Vitamin D (CALCIUM 500 PO) Take by mouth.    . Multiple Vitamins-Minerals (MULTI COMPLETE PO) Take by mouth.     No current facility-administered medications for this visit.     Family History  Problem Relation Age of Onset  . Hypertension Father   . Cancer Father        oral cancer  . Leukemia Father        lymphocytic  . Dementia Father   . Lung cancer Maternal Grandmother     ROS:  Pertinent items are noted in HPI.  Otherwise, a comprehensive ROS was negative.  Exam:   BP 110/72   Pulse 74   Ht 5\' 7"  (1.702 m)   Wt 152 lb 12.8 oz (69.3 kg)   LMP 01/13/2018 (Exact Date)   BMI 23.93 kg/m  Height: 5\' 7"  (170.2 cm) Ht Readings from Last 3 Encounters:  01/23/18 5\' 7"  (1.702 m)  12/19/16 5' 6.25" (1.683 m)  07/19/15 5' 6.25" (1.683 m)    General appearance: alert, cooperative and appears stated age Head: Normocephalic, without obvious abnormality, atraumatic Neck: no adenopathy,  supple, symmetrical, trachea midline and thyroid normal to inspection and palpation Lungs: clear to auscultation bilaterally Breasts: normal appearance, no masses or tenderness, No nipple retraction or dimpling, No nipple discharge or bleeding, No axillary or supraclavicular adenopathy Heart: regular rate and rhythm Abdomen: soft, non-tender; no masses,  no organomegaly Extremities: extremities normal, atraumatic, no cyanosis or edema Skin: Skin color, texture, turgor normal. No rashes or lesions Lymph nodes: Cervical, supraclavicular, and axillary nodes normal. No abnormal inguinal nodes palpated Neurologic: Grossly normal   Pelvic: External genitalia:  no lesions              Urethra:  normal appearing urethra with no masses, tenderness or lesions              Bartholin's and Skene's: normal                 Vagina: normal appearing vagina with normal color and discharge, no lesions              Cervix: multiparous appearance, no cervical motion tenderness and no lesions              Pap taken: Yes.   Bimanual Exam:  Uterus:  normal size, contour, position, consistency, mobility, non-tender and anteflexed  Adnexa: normal adnexa and no mass, fullness, tenderness               Rectovaginal: Confirms               Anus:  normal sphincter tone, no lesions  Chaperone present: yes  A:  Well Woman with normal exam  Contraception spouse vasectomy  Screening labs  P:   Reviewed health and wellness pertinent to exam  Labs: CMP,TSH,Vitamin D, Lipid panel  Pap smear: yes   counseled on breast self exam, feminine hygiene, adequate intake of calcium and vitamin D, diet and exercise  return annually or prn  An After Visit Summary was printed and given to the patient.

## 2018-01-23 ENCOUNTER — Ambulatory Visit (INDEPENDENT_AMBULATORY_CARE_PROVIDER_SITE_OTHER): Payer: 59 | Admitting: Certified Nurse Midwife

## 2018-01-23 ENCOUNTER — Encounter: Payer: Self-pay | Admitting: Certified Nurse Midwife

## 2018-01-23 ENCOUNTER — Other Ambulatory Visit (HOSPITAL_COMMUNITY)
Admission: RE | Admit: 2018-01-23 | Discharge: 2018-01-23 | Disposition: A | Discharge status: Home / Self Care | Payer: 59 | Source: Ambulatory Visit | Attending: Obstetrics & Gynecology | Admitting: Obstetrics & Gynecology

## 2018-01-23 VITALS — BP 110/72 | HR 74 | Ht 67.0 in | Wt 152.8 lb

## 2018-01-23 DIAGNOSIS — Z01419 Encounter for gynecological examination (general) (routine) without abnormal findings: Secondary | ICD-10-CM | POA: Diagnosis not present

## 2018-01-23 DIAGNOSIS — Z0000 Encounter for general adult medical examination without abnormal findings: Secondary | ICD-10-CM | POA: Diagnosis not present

## 2018-01-23 DIAGNOSIS — Z124 Encounter for screening for malignant neoplasm of cervix: Secondary | ICD-10-CM | POA: Diagnosis not present

## 2018-01-23 DIAGNOSIS — E559 Vitamin D deficiency, unspecified: Secondary | ICD-10-CM | POA: Diagnosis not present

## 2018-01-23 NOTE — Patient Instructions (Signed)

## 2018-01-24 LAB — COMPREHENSIVE METABOLIC PANEL
ALBUMIN: 4.4 g/dL (ref 3.5–5.5)
ALT: 9 IU/L (ref 0–32)
AST: 14 IU/L (ref 0–40)
Albumin/Globulin Ratio: 1.9 (ref 1.2–2.2)
Alkaline Phosphatase: 56 IU/L (ref 39–117)
BUN / CREAT RATIO: 13 (ref 9–23)
BUN: 10 mg/dL (ref 6–20)
Bilirubin Total: 0.8 mg/dL (ref 0.0–1.2)
CALCIUM: 8.8 mg/dL (ref 8.7–10.2)
CHLORIDE: 105 mmol/L (ref 96–106)
CO2: 23 mmol/L (ref 20–29)
Creatinine, Ser: 0.79 mg/dL (ref 0.57–1.00)
GFR calc non Af Amer: 97 mL/min/{1.73_m2} (ref >59)
GFR, EST AFRICAN AMERICAN: 112 mL/min/{1.73_m2} (ref >59)
GLUCOSE: 97 mg/dL (ref 65–99)
Globulin, Total: 2.3 g/dL (ref 1.5–4.5)
Potassium: 4.6 mmol/L (ref 3.5–5.2)
SODIUM: 140 mmol/L (ref 134–144)
TOTAL PROTEIN: 6.7 g/dL (ref 6.0–8.5)

## 2018-01-24 LAB — CBC
HEMATOCRIT: 42.2 % (ref 34.0–46.6)
Hemoglobin: 13.6 g/dL (ref 11.1–15.9)
MCH: 29.4 pg (ref 26.6–33.0)
MCHC: 32.2 g/dL (ref 31.5–35.7)
MCV: 91 fL (ref 79–97)
PLATELETS: 254 10*3/uL (ref 150–450)
RBC: 4.62 x10E6/uL (ref 3.77–5.28)
RDW: 13.3 % (ref 12.3–15.4)
WBC: 4.7 10*3/uL (ref 3.4–10.8)

## 2018-01-24 LAB — LIPID PANEL
Chol/HDL Ratio: 1.9 ratio (ref 0.0–4.4)
Cholesterol, Total: 142 mg/dL (ref 100–199)
HDL: 76 mg/dL (ref >39)
LDL Calculated: 58 mg/dL (ref 0–99)
Triglycerides: 38 mg/dL (ref 0–149)
VLDL Cholesterol Cal: 8 mg/dL (ref 5–40)

## 2018-01-24 LAB — TSH: TSH: 1.12 u[IU]/mL (ref 0.450–4.500)

## 2018-01-24 LAB — VITAMIN D 25 HYDROXY (VIT D DEFICIENCY, FRACTURES): VIT D 25 HYDROXY: 33.3 ng/mL (ref 30.0–100.0)

## 2018-01-26 LAB — CYTOLOGY - PAP
Diagnosis: NEGATIVE
HPV: NOT DETECTED

## 2019-01-29 ENCOUNTER — Other Ambulatory Visit: Payer: Self-pay

## 2019-02-02 ENCOUNTER — Ambulatory Visit: Payer: 59 | Admitting: Certified Nurse Midwife

## 2019-02-18 ENCOUNTER — Other Ambulatory Visit: Payer: Self-pay

## 2019-02-22 ENCOUNTER — Ambulatory Visit: Payer: 59 | Admitting: Certified Nurse Midwife

## 2019-02-22 ENCOUNTER — Encounter: Payer: Self-pay | Admitting: Certified Nurse Midwife

## 2019-02-22 ENCOUNTER — Other Ambulatory Visit: Payer: Self-pay

## 2019-02-22 VITALS — BP 120/80 | HR 68 | Temp 97.4°F | Resp 16 | Ht 66.75 in | Wt 149.0 lb

## 2019-02-22 DIAGNOSIS — Z01419 Encounter for gynecological examination (general) (routine) without abnormal findings: Secondary | ICD-10-CM | POA: Diagnosis not present

## 2019-02-22 DIAGNOSIS — Z0000 Encounter for general adult medical examination without abnormal findings: Secondary | ICD-10-CM | POA: Diagnosis not present

## 2019-02-22 DIAGNOSIS — E559 Vitamin D deficiency, unspecified: Secondary | ICD-10-CM

## 2019-02-22 NOTE — Patient Instructions (Signed)

## 2019-02-22 NOTE — Progress Notes (Signed)
38 y.o. G18P2002 Married  Caucasian Fe here for annual exam. Periods normal no issues. No missed periods in past year. Now on Spirolactone for acne for the past month. Sees Urgent Care if needed. Screening labs desired. 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 No other health issues today.  LMP 02/02/2019       Sexually active: Yes.    The current method of family planning is vasectomy.    Exercising: Yes.    running Smoker:  no  Review of Systems  Constitutional: Negative.   HENT: Negative.   Eyes: Negative.   Respiratory: Negative.   Cardiovascular: Negative.   Gastrointestinal: Negative.   Genitourinary: Negative.   Musculoskeletal: Negative.   Skin: Negative.   Neurological: Negative.   Endo/Heme/Allergies: Negative.   Psychiatric/Behavioral: Negative.     Health Maintenance: Pap:  07-19-15 neg, 01-23-18 neg HPV HR neg History of Abnormal Pap: no MMG:  none Self Breast exams: yes Colonoscopy:  none BMD:   none TDaP:  2014 Shingles: no Pneumonia: no Hep C and HIV: yes, donated blood Labs: yes   reports that she has never smoked. She has never used smokeless tobacco. She reports current alcohol use of about 5.0 standard drinks of alcohol per week. She reports that she does not use drugs.  No past medical history on file.  No past surgical history on file.  Current Outpatient Medications  Medication Sig Dispense Refill  . Calcium-Magnesium-Vitamin D (CALCIUM 500 PO) Take by mouth.    . Multiple Vitamins-Minerals (MULTI COMPLETE PO) Take by mouth.     No current facility-administered medications for this visit.     Family History  Problem Relation Age of Onset  . Hypertension Father   . Cancer Father        oral cancer  . Leukemia Father        lymphocytic  . Dementia Father   . Lung cancer Maternal Grandmother     ROS:  Pertinent items are noted in HPI.  Otherwise, a comprehensive ROS was negative.  Exam:   There were no vitals taken for this  visit.   Ht Readings from Last 3 Encounters:  01/23/18 5\' 7"  (1.702 m)  12/19/16 5' 6.25" (1.683 m)  07/19/15 5' 6.25" (1.683 m)    General appearance: alert, cooperative and appears stated age Head: Normocephalic, without obvious abnormality, atraumatic Neck: no adenopathy, supple, symmetrical, trachea midline and thyroid normal to inspection and palpation Lungs: clear to auscultation bilaterally Breasts: normal appearance, no masses or tenderness, No nipple retraction or dimpling, No nipple discharge or bleeding, No axillary or supraclavicular adenopathy Heart: regular rate and rhythm Abdomen: soft, non-tender; no masses,  no organomegaly Extremities: extremities normal, atraumatic, no cyanosis or edema Skin: Skin color, texture, turgor normal. No rashes or lesions Lymph nodes: Cervical, supraclavicular, and axillary nodes normal. No abnormal inguinal nodes palpated Neurologic: Grossly normal   Pelvic: External genitalia:  no lesions              Urethra:  normal appearing urethra with no masses, tenderness or lesions              Bartholin's and Skene's: normal                 Vagina: normal appearing vagina with normal color and discharge, no lesions              Cervix: multiparous appearance, no cervical motion tenderness, no lesions and scant blood noted from cervix ?  period              Pap taken: No. Bimanual Exam:  Uterus:  normal size, contour, position, consistency, mobility, non-tender and anteverted              Adnexa: normal adnexa and no mass, fullness, tenderness               Rectovaginal: Confirms               Anus:  normal sphincter tone, no lesions  Chaperone present: yes  A:  Well Woman with normal exam  Contraception spouse vasectomy  Screening labs    P:   Reviewed health and wellness pertinent to exam.  Discussed emotional health with family all the changes with school.   Labs:CBC, CMP,Lipid panel, TSH, Vitamin D  Pap smear: no  counseled on  breast self exam, feminine hygiene, adequate intake of calcium and vitamin D, diet and exercise  return annually or prn  An After Visit Summary was printed and given to the patient.

## 2019-02-23 LAB — COMPREHENSIVE METABOLIC PANEL
ALT: 8 IU/L (ref 0–32)
AST: 16 IU/L (ref 0–40)
Albumin/Globulin Ratio: 2.3 — ABNORMAL HIGH (ref 1.2–2.2)
Albumin: 4.5 g/dL (ref 3.8–4.8)
Alkaline Phosphatase: 65 IU/L (ref 39–117)
BUN/Creatinine Ratio: 23 (ref 9–23)
BUN: 18 mg/dL (ref 6–20)
Bilirubin Total: 0.6 mg/dL (ref 0.0–1.2)
CO2: 17 mmol/L — ABNORMAL LOW (ref 20–29)
Calcium: 8.8 mg/dL (ref 8.7–10.2)
Chloride: 108 mmol/L — ABNORMAL HIGH (ref 96–106)
Creatinine, Ser: 0.8 mg/dL (ref 0.57–1.00)
GFR calc Af Amer: 110 mL/min/{1.73_m2} (ref >59)
GFR calc non Af Amer: 95 mL/min/{1.73_m2} (ref >59)
Globulin, Total: 2 g/dL (ref 1.5–4.5)
Glucose: 79 mg/dL (ref 65–99)
Potassium: 4.8 mmol/L (ref 3.5–5.2)
Sodium: 141 mmol/L (ref 134–144)
Total Protein: 6.5 g/dL (ref 6.0–8.5)

## 2019-02-23 LAB — LIPID PANEL
Chol/HDL Ratio: 2.3 ratio (ref 0.0–4.4)
Cholesterol, Total: 155 mg/dL (ref 100–199)
HDL: 66 mg/dL (ref >39)
LDL Calculated: 83 mg/dL (ref 0–99)
Triglycerides: 32 mg/dL (ref 0–149)
VLDL Cholesterol Cal: 6 mg/dL (ref 5–40)

## 2019-02-23 LAB — CBC
Hematocrit: 40.5 % (ref 34.0–46.6)
Hemoglobin: 13.9 g/dL (ref 11.1–15.9)
MCH: 30.5 pg (ref 26.6–33.0)
MCHC: 34.3 g/dL (ref 31.5–35.7)
MCV: 89 fL (ref 79–97)
Platelets: 237 10*3/uL (ref 150–450)
RBC: 4.55 x10E6/uL (ref 3.77–5.28)
RDW: 12 % (ref 11.7–15.4)
WBC: 5.8 10*3/uL (ref 3.4–10.8)

## 2019-02-23 LAB — TSH: TSH: 1.11 u[IU]/mL (ref 0.450–4.500)

## 2019-02-23 LAB — VITAMIN D 25 HYDROXY (VIT D DEFICIENCY, FRACTURES): Vit D, 25-Hydroxy: 50.3 ng/mL (ref 30.0–100.0)

## 2019-09-24 ENCOUNTER — Encounter: Payer: Self-pay | Admitting: Certified Nurse Midwife

## 2020-02-23 ENCOUNTER — Ambulatory Visit: Payer: 59 | Admitting: Certified Nurse Midwife

## 2020-05-16 NOTE — Progress Notes (Signed)
39 y.o. G24P2002 Married Caucasian female here for annual exam.    Patient has had several cycles 18-19 days apart. This occurred 3 cycles.  Spotting on 10/3 and then 10/23.   No pain with menses.   No hot flashes.   Taking spironolactone for acne.  It is working well.   Used combined oral contraception in the past and did well with it.   Some stress.   Children are 30 and 51 yo.  Had Covid vaccine.  No flu vaccine yet.   PCP:  None  Patient's last menstrual period was 04/09/2020.     Period Duration (Days): 5 days Period Pattern: (!) Irregular Menstrual Flow: Light Menstrual Control: Maxi pad Dysmenorrhea: (!) Mild Dysmenorrhea Symptoms: Cramping     Sexually active: Yes.    The current method of family planning is vasectomy.    Exercising: Yes.    walking Smoker:  no  Health Maintenance: Pap: 01-23-18 Neg:Neg HR HPV, 07-19-15 Neg, 06-15-13 Neg:Neg HR HPv History of abnormal Pap:  no MMG:  n/a Colonoscopy:  n/a BMD:   n/a  Result  n/a TDaP:  2014 Gardasil:   no HIV: donates blood Hep C: donates blood Screening Labs:  Today.    reports that she has never smoked. She has never used smokeless tobacco. She reports current alcohol use of about 1.0 standard drink of alcohol per week. She reports that she does not use drugs.  No past medical history on file.  No past surgical history on file.  Current Outpatient Medications  Medication Sig Dispense Refill  . Calcium-Magnesium-Vitamin D (CALCIUM 500 PO) Take by mouth.    . Multiple Vitamins-Minerals (MULTI COMPLETE PO) Take by mouth.    . spironolactone (ALDACTONE) 50 MG tablet Take 50 mg by mouth 2 (two) times daily.    Marland Kitchen tretinoin (ALTRALIN) 0.05 % gel SMARTSIG:1 Topical Every Night     No current facility-administered medications for this visit.    Family History  Problem Relation Age of Onset  . Hypertension Father   . Cancer Father        oral cancer  . Leukemia Father        lymphocytic  . Dementia  Father   . Lung cancer Maternal Grandmother     Review of Systems  All other systems reviewed and are negative.   Exam:   BP 140/82   Pulse (!) 104   Ht 5' 6.5" (1.689 m)   Wt 149 lb (67.6 kg)   LMP 04/09/2020   SpO2 99%   BMI 23.69 kg/m     General appearance: alert, cooperative and appears stated age Head: normocephalic, without obvious abnormality, atraumatic Neck: no adenopathy, supple, symmetrical, trachea midline and thyroid normal to inspection and palpation Lungs: clear to auscultation bilaterally Breasts: normal appearance, no masses or tenderness, No nipple retraction or dimpling, No nipple discharge or bleeding, No axillary adenopathy Heart: regular rate and rhythm Abdomen: soft, non-tender; no masses, no organomegaly Extremities: extremities normal, atraumatic, no cyanosis or edema Skin: skin color, texture, turgor normal. No rashes or lesions Lymph nodes: cervical, supraclavicular, and axillary nodes normal. Neurologic: grossly normal  Pelvic: External genitalia:  no lesions              No abnormal inguinal nodes palpated.              Urethra:  normal appearing urethra with no masses, tenderness or lesions  Bartholins and Skenes: normal                 Vagina: normal appearing vagina with normal color and discharge, no lesions              Cervix: no lesions              Pap taken: No. Bimanual Exam:  Uterus:  normal size, contour, position, consistency, mobility, non-tender              Adnexa: no mass, fullness, tenderness               Chaperone was present for exam.  Assessment:   Well woman visit with normal exam. Irregular menses.  On spironolactone for acne control.  Plan: Mammogram screening discussed. Self breast awareness reviewed. Pap and HR HPV as above. Guidelines for Calcium, Vitamin D, regular exercise program including cardiovascular and weight bearing exercise. Start Gardasil series.  Check FSH, LH, estradiol, TSH.   Routine labs.  Patient prefers to monitor her menses and not start any hormonal management of this.  Follow up annually and prn.

## 2020-05-17 ENCOUNTER — Ambulatory Visit: Payer: 59 | Admitting: Obstetrics and Gynecology

## 2020-05-17 ENCOUNTER — Encounter: Payer: Self-pay | Admitting: Obstetrics and Gynecology

## 2020-05-17 ENCOUNTER — Other Ambulatory Visit: Payer: Self-pay

## 2020-05-17 VITALS — BP 140/82 | HR 104 | Ht 66.5 in | Wt 149.0 lb

## 2020-05-17 DIAGNOSIS — N926 Irregular menstruation, unspecified: Secondary | ICD-10-CM

## 2020-05-17 DIAGNOSIS — Z23 Encounter for immunization: Secondary | ICD-10-CM

## 2020-05-17 DIAGNOSIS — Z01419 Encounter for gynecological examination (general) (routine) without abnormal findings: Secondary | ICD-10-CM | POA: Diagnosis not present

## 2020-05-17 NOTE — Patient Instructions (Signed)

## 2020-05-18 LAB — CBC
Hematocrit: 42.3 % (ref 34.0–46.6)
Hemoglobin: 14.4 g/dL (ref 11.1–15.9)
MCH: 30.6 pg (ref 26.6–33.0)
MCHC: 34 g/dL (ref 31.5–35.7)
MCV: 90 fL (ref 79–97)
Platelets: 282 10*3/uL (ref 150–450)
RBC: 4.7 x10E6/uL (ref 3.77–5.28)
RDW: 12.1 % (ref 11.7–15.4)
WBC: 9.6 10*3/uL (ref 3.4–10.8)

## 2020-05-18 LAB — COMPREHENSIVE METABOLIC PANEL
ALT: 9 IU/L (ref 0–32)
AST: 15 IU/L (ref 0–40)
Albumin/Globulin Ratio: 1.8 (ref 1.2–2.2)
Albumin: 4.7 g/dL (ref 3.8–4.8)
Alkaline Phosphatase: 54 IU/L (ref 44–121)
BUN/Creatinine Ratio: 11 (ref 9–23)
BUN: 9 mg/dL (ref 6–20)
Bilirubin Total: 0.5 mg/dL (ref 0.0–1.2)
CO2: 23 mmol/L (ref 20–29)
Calcium: 9.6 mg/dL (ref 8.7–10.2)
Chloride: 103 mmol/L (ref 96–106)
Creatinine, Ser: 0.85 mg/dL (ref 0.57–1.00)
GFR calc Af Amer: 101 mL/min/{1.73_m2} (ref >59)
GFR calc non Af Amer: 88 mL/min/{1.73_m2} (ref >59)
Globulin, Total: 2.6 g/dL (ref 1.5–4.5)
Glucose: 89 mg/dL (ref 65–99)
Potassium: 4.3 mmol/L (ref 3.5–5.2)
Sodium: 140 mmol/L (ref 134–144)
Total Protein: 7.3 g/dL (ref 6.0–8.5)

## 2020-05-18 LAB — LIPID PANEL
Chol/HDL Ratio: 2.2 ratio (ref 0.0–4.4)
Cholesterol, Total: 185 mg/dL (ref 100–199)
HDL: 83 mg/dL (ref >39)
LDL Chol Calc (NIH): 94 mg/dL (ref 0–99)
Triglycerides: 38 mg/dL (ref 0–149)
VLDL Cholesterol Cal: 8 mg/dL (ref 5–40)

## 2020-05-18 LAB — FSH/LH
FSH: 2.6 m[IU]/mL
LH: 1.9 m[IU]/mL

## 2020-05-18 LAB — PROLACTIN: Prolactin: 12.6 ng/mL (ref 4.8–23.3)

## 2020-05-18 LAB — ESTRADIOL: Estradiol: 189 pg/mL

## 2020-05-18 LAB — VITAMIN D 25 HYDROXY (VIT D DEFICIENCY, FRACTURES): Vit D, 25-Hydroxy: 41.9 ng/mL (ref 30.0–100.0)

## 2020-05-18 LAB — TSH: TSH: 1.24 u[IU]/mL (ref 0.450–4.500)

## 2020-07-18 ENCOUNTER — Ambulatory Visit (INDEPENDENT_AMBULATORY_CARE_PROVIDER_SITE_OTHER): Payer: 59

## 2020-07-18 ENCOUNTER — Other Ambulatory Visit: Payer: Self-pay

## 2020-07-18 VITALS — BP 112/60 | HR 68 | Ht 67.0 in | Wt 155.0 lb

## 2020-07-18 DIAGNOSIS — Z23 Encounter for immunization: Secondary | ICD-10-CM | POA: Diagnosis not present

## 2020-07-18 NOTE — Progress Notes (Signed)
Patient in today for second Gardasil injection.   Contraception: vasectomy  LMP: 07/15/20 Last AEX: 05-17-20 with Dr. Edward Jolly  Injection given in left deltoid. Patient tolerated shot well.   Patient informed next injection due in about four months.  Advised patient, if not on birth control, to return for next injection with cycle.   Routed to provider for final review.  Encounter closed.

## 2020-11-16 ENCOUNTER — Ambulatory Visit (INDEPENDENT_AMBULATORY_CARE_PROVIDER_SITE_OTHER): Payer: 59 | Admitting: *Deleted

## 2020-11-16 ENCOUNTER — Other Ambulatory Visit: Payer: Self-pay

## 2020-11-16 DIAGNOSIS — Z23 Encounter for immunization: Secondary | ICD-10-CM

## 2021-01-17 ENCOUNTER — Encounter: Payer: Self-pay | Admitting: Obstetrics and Gynecology

## 2021-01-17 ENCOUNTER — Other Ambulatory Visit: Payer: Self-pay

## 2021-01-17 ENCOUNTER — Ambulatory Visit (INDEPENDENT_AMBULATORY_CARE_PROVIDER_SITE_OTHER): Payer: 59 | Admitting: Obstetrics and Gynecology

## 2021-01-17 VITALS — BP 138/80

## 2021-01-17 DIAGNOSIS — F410 Panic disorder [episodic paroxysmal anxiety] without agoraphobia: Secondary | ICD-10-CM

## 2021-01-17 DIAGNOSIS — F419 Anxiety disorder, unspecified: Secondary | ICD-10-CM | POA: Diagnosis not present

## 2021-01-17 MED ORDER — PAROXETINE HCL 10 MG PO TABS: 10.0000 mg | ORAL_TABLET | ORAL | 1 refills | Status: DC

## 2021-01-17 MED ORDER — ALPRAZOLAM 0.25 MG PO TABS: 0.2500 mg | ORAL_TABLET | Freq: Three times a day (TID) | ORAL | 1 refills | Status: DC | PRN

## 2021-01-17 NOTE — Patient Instructions (Signed)
Paroxetine Tablets What is this medication? PAROXETINE (pa ROX e teen) treats depression, anxiety, obsessive-compulsive disorder (OCD), post-traumatic stress disorder (PTSD), and premenstrual dysphoric disorder (PMDD). It increases the amount of serotonin in the brain, a hormone that helps regulate mood. It belongs to a group of medications calledSSRIs. This medicine may be used for other purposes; ask your health care provider orpharmacist if you have questions. COMMON BRAND NAME(S): Paxil, Pexeva What should I tell my care team before I take this medication? They need to know if you have any of these conditions: Bipolar disorder or a family history of bipolar disorder Bleeding disorders Glaucoma Heart disease Kidney disease Liver disease Low levels of sodium in the blood Seizures Suicidal thoughts, plans, or attempt; a previous suicide attempt by you or a family member Take MAOIs like Carbex, Eldepryl, Marplan, Nardil, and Parnate Take medications that treat or prevent blood clots Thyroid disease An unusual or allergic reaction to paroxetine, other medications, foods, dyes, or preservatives Pregnant or trying to get pregnant Breast-feeding How should I use this medication? Take this medication by mouth with a glass of water. Follow the directions on the prescription label. You can take it with or without food. Take your medication at regular intervals. Do not take your medication more often than directed. Do not stop taking this medication suddenly except upon the advice of your care team. Stopping this medication too quickly may cause serious sideeffects or your condition may worsen. A special MedGuide will be given to you by the pharmacist with eachprescription and refill. Be sure to read this information carefully each time. Talk to your care team regarding the use of this medication in children.Special care may be needed. Overdosage: If you think you have taken too much of this  medicine contact apoison control center or emergency room at once. NOTE: This medicine is only for you. Do not share this medicine with others. What if I miss a dose? If you miss a dose, take it as soon as you can. If it is almost time for yournext dose, take only that dose. Do not take double or extra doses. What may interact with this medication? Do not take this medication with any of the following: Linezolid MAOIs like Carbex, Eldepryl, Marplan, Nardil, and Parnate Methylene blue (injected into a vein) Pimozide Thioridazine This medication may also interact with the following: Alcohol Amphetamines Aspirin and aspirin-like medications Atomoxetine Certain medications for depression, anxiety, or psychotic disturbances Certain medications for irregular heart beat like propafenone, flecainide, encainide, and quinidine Certain medications for migraine headache like almotriptan, eletriptan, frovatriptan, naratriptan, rizatriptan, sumatriptan, zolmitriptan Cimetidine Digoxin Diuretics Fentanyl Fosamprenavir Furazolidone Isoniazid Lithium Medications that treat or prevent blood clots like warfarin, enoxaparin, and dalteparin Medications for sleep NSAIDs, medications for pain and inflammation, like ibuprofen or naproxen Phenobarbital Phenytoin Procarbazine Rasagiline Ritonavir Supplements like St. John's wort, kava kava, valerian Tamoxifen Tramadol Tryptophan This list may not describe all possible interactions. Give your health care provider a list of all the medicines, herbs, non-prescription drugs, or dietary supplements you use. Also tell them if you smoke, drink alcohol, or use illegaldrugs. Some items may interact with your medicine. What should I watch for while using this medication? Tell your care team if your symptoms do not get better or if they get worse. Visit your care team for regular checks on your progress. Because it may take several weeks to see the full effects  of this medication, it is important tocontinue your treatment as prescribed by your care  team. Watch for new or worsening thoughts of suicide or depression. This includes sudden changes in mood, behaviors, or thoughts. These changes can happen at any time but are more common in the beginning of treatment or after a change in dose. Call your care team right away if you experience these thoughts orworsening depression. Manic episodes may happen in patients with bipolar disorder who take this medication. Watch for changes in feelings or behaviors such as feeling anxious, nervous, agitated, panicky, irritable, hostile, aggressive, impulsive, severely restless, overly excited and hyperactive, or trouble sleeping. These changes can happen at any time but are more common in the beginning of treatment or after a change in dose. Call your care team right away if you notice any ofthese symptoms. You may get drowsy or dizzy. Do not drive, use machinery, or do anything that needs mental alertness until you know how this medication affects you. Do not stand or sit up quickly, especially if you are an older patient. This reduces the risk of dizzy or fainting spells. Alcohol may interfere with the effect ofthis medication. Avoid alcoholic drinks. Your mouth may get dry. Chewing sugarless gum or sucking hard candy, and drinking plenty of water will help. Contact your care team if the problem doesnot go away or is severe. What side effects may I notice from receiving this medication? Side effects that you should report to your care team as soon as possible: Allergic reactions-skin rash, itching, hives, swelling of the face, lips, tongue, or throat Bleeding-bloody or black, tar-like stools, red or dark brown urine, vomiting blood or brown material that looks like coffee grounds, small, red or purple spots on skin, unusual bleeding or bruising Heart rhythm changes-fast or irregular heartbeat, dizziness, feeling faint or  lightheaded, chest pain, trouble breathing Low sodium level-muscle weakness, fatigue, dizziness, headache, confusion Serotonin syndrome-irritability, confusion, fast or irregular heartbeat, muscle stiffness, twitching muscles, sweating, high fever, seizures, chills, vomiting, diarrhea Sudden eye pain or change in vision such as blurry vision, seeing halos around lights, vision loss Thoughts of suicide or self-harm, worsening mood, feelings of depression Side effects that usually do not require medical attention (report to your careteam if they continue or are bothersome): Change in sex drive or performance Diarrhea Excessive sweating Nausea Tremors or shaking Upset stomach This list may not describe all possible side effects. Call your doctor for medical advice about side effects. You may report side effects to FDA at1-800-FDA-1088. Where should I keep my medication? Keep out of the reach of children and pets. Store at room temperature between 15 and 30 degrees C (59 and 86 degrees F). Keep container tightly closed. Throw away any unused medication after theexpiration date. NOTE: This sheet is a summary. It may not cover all possible information. If you have questions about this medicine, talk to your doctor, pharmacist, orhealth care provider.  2022 Elsevier/Gold Standard (2020-05-15 14:25:05)

## 2021-01-17 NOTE — Telephone Encounter (Signed)
Dr. Duanne Guess has relocated to [facility], just in case the patient was not aware.   If she does not have a physician patient relationship with her anymore, I can work her in this afternoon.

## 2021-01-17 NOTE — Telephone Encounter (Signed)
I called and discussed the below with patient. She last saw Dr.Dewey in 2014, so she would be a new patient at her office. I did tell her that Dr.Dewey new office is location. Patient will be arrived at 2pm as a work per me and Dr.Silva face to face conversation.

## 2021-01-17 NOTE — Telephone Encounter (Addendum)
Dr.Silva did you want to patient here today as work in? Or best to go to Urgent Care.  I called patient and the "trip" is work related. She reports having panic attacks in the past, this would be her 3rd one. She had a PCP Dr.Dewey and she prescribed Xanax to help with attach at the time.No long a patient with Dr.Dewey.  She does not feel like she needs ER ( I discuss precautions with her) I did suggest after this panic attack she start to look for another PCP.

## 2021-01-17 NOTE — Progress Notes (Signed)
GYNECOLOGY  VISIT   HPI: 40 y.o.   Married  Caucasian  female   G2P2002 with Patient's last menstrual period was 01/15/2021.   here for   anxiety consult  Anxiety started last night.  Feeling overwhelmed lately. New job started in May.  Supposed to travel to [place] tomorrow for work.  Will meet colleagues for the first time.   Feeling increased stress and anxiety but not depression.  Children are now out of school, life transition.  Children are 38 and 46 yo. Dog injured.  Denies suicidal ideation.   Husband and mother are supportive.   Does ok flying.   Has had anxiety in the past.  Had a panic attack a year following childbirth.  Happened again in 2014 or 2016 flying to [place] for a wedding.   She has taken Xanax in the past through PCP, and this helped her.  She used Lexapro and it increased her heart rate after a few days.   GYNECOLOGIC HISTORY: Patient's last menstrual period was 01/15/2021. Contraception:  Vasectomy Menopausal hormone therapy:  never Last mammogram:  Never Last pap smear:   01-23-18        OB History     Gravida  2   Para  2   Term  2   Preterm      AB      Living  2      SAB      IAB      Ectopic      Multiple      Live Births                 Patient Active Problem List   Diagnosis Date Noted   GAD (generalized anxiety disorder) 04/19/2011    No past medical history on file.  No past surgical history on file.  Current Outpatient Medications  Medication Sig Dispense Refill   ALPRAZolam (XANAX) 0.25 MG tablet Take 1 tablet (0.25 mg total) by mouth 3 (three) times daily as needed for anxiety. 30 tablet 1   Calcium-Magnesium-Vitamin D (CALCIUM 500 PO) Take by mouth.     Multiple Vitamins-Minerals (MULTI COMPLETE PO) Take by mouth.     PARoxetine (PAXIL) 10 MG tablet Take 1 tablet (10 mg total) by mouth every morning. 30 tablet 1   spironolactone (ALDACTONE) 50 MG tablet Take 50 mg by mouth 2 (two) times daily.      tretinoin (ALTRALIN) 0.05 % gel SMARTSIG:1 Topical Every Night     No current facility-administered medications for this visit.     ALLERGIES: Patient has no known allergies.  Family History  Problem Relation Age of Onset   Hypertension Father    Cancer Father        oral cancer   Leukemia Father        lymphocytic   Dementia Father    Lung cancer Maternal Grandmother     Social History   Socioeconomic History   Marital status: Married    Spouse name: Not on file   Number of children: Not on file   Years of education: Not on file   Highest education level: Not on file  Occupational History   Not on file  Tobacco Use   Smoking status: Never   Smokeless tobacco: Never  Vaping Use   Vaping Use: Never used  Substance and Sexual Activity   Alcohol use: Yes    Alcohol/week: 1.0 standard drink    Types: 1 Glasses of wine per  week   Drug use: No   Sexual activity: Yes    Partners: Male    Birth control/protection: Other-see comments    Comment: vasectomy  Other Topics Concern   Not on file  Social History Narrative   Not on file   Social Determinants of Health   Financial Resource Strain: Not on file  Food Insecurity: Not on file  Transportation Needs: Not on file  Physical Activity: Not on file  Stress: Not on file  Social Connections: Not on file  Intimate Partner Violence: Not on file    Review of Systems  See HPI.  PHYSICAL EXAMINATION:    BP 138/80 (BP Location: Left Arm, Patient Position: Sitting, Cuff Size: Normal)   LMP 01/15/2021     General appearance: alert, cooperative, anxious and tearful.    ASSESSMENT Anxiety. Panic.  PLAN Start Paxil 10 mg daily.  Side effects discussed.  Xanax 0.25 mg po tid prn.  Counseling and life coaching recommended.  [place] counseling brochure to patient.  She will discuss with her family if travel is appropriate for her tomorrow. Return in 6 weeks for recheck.    An After Visit Summary was printed and given  to the patient.  29 min  total time was spent for this patient encounter, including preparation, face-to-face counseling with the patient, coordination of care, and documentation of the encounter.

## 2021-03-01 ENCOUNTER — Other Ambulatory Visit: Payer: Self-pay

## 2021-03-01 ENCOUNTER — Ambulatory Visit (INDEPENDENT_AMBULATORY_CARE_PROVIDER_SITE_OTHER): Payer: 59 | Admitting: Obstetrics and Gynecology

## 2021-03-01 DIAGNOSIS — F419 Anxiety disorder, unspecified: Secondary | ICD-10-CM | POA: Diagnosis not present

## 2021-03-01 DIAGNOSIS — Z5181 Encounter for therapeutic drug level monitoring: Secondary | ICD-10-CM | POA: Diagnosis not present

## 2021-03-01 MED ORDER — PAROXETINE HCL 10 MG PO TABS: 10.0000 mg | ORAL_TABLET | ORAL | 0 refills | Status: DC

## 2021-03-01 NOTE — Progress Notes (Signed)
GYNECOLOGY  VISIT   HPI: 40 y.o.   Married  Caucasian  female   G2P2002 with No LMP recorded.   here for medication follow up.  Taking Paxil 10 mg daily for anxiety.   Feeling more stable.   Not feeling overwhelmed.  Taking in the am.  No fatigue.  Eating ok and sleeping Ok.  Not taking Xanax but carries it with her just in case she has a panic attack.  Used on her trip only.  GYNECOLOGIC HISTORY: No LMP recorded. Contraception:  Vasectomy Menopausal hormone therapy:  n/a Last mammogram:  n/a Last pap smear: 01-23-18 Neg:Neg HR HPV, 07-19-15 Neg, 06-14-13 Neg:Neg HR HPV        OB History     Gravida  2   Para  2   Term  2   Preterm      AB      Living  2      SAB      IAB      Ectopic      Multiple      Live Births                 Patient Active Problem List   Diagnosis Date Noted   GAD (generalized anxiety disorder) 04/19/2011    No past medical history on file.  No past surgical history on file.  Current Outpatient Medications  Medication Sig Dispense Refill   ALPRAZolam (XANAX) 0.25 MG tablet Take 1 tablet (0.25 mg total) by mouth 3 (three) times daily as needed for anxiety. 30 tablet 1   Calcium-Magnesium-Vitamin D (CALCIUM 500 PO) Take by mouth.     Multiple Vitamins-Minerals (MULTI COMPLETE PO) Take by mouth.     PARoxetine (PAXIL) 10 MG tablet Take 1 tablet (10 mg total) by mouth every morning. 90 tablet 0   spironolactone (ALDACTONE) 50 MG tablet Take 50 mg by mouth 2 (two) times daily.     tretinoin (ALTRALIN) 0.05 % gel SMARTSIG:1 Topical Every Night     No current facility-administered medications for this visit.     ALLERGIES: Patient has no known allergies.  Family History  Problem Relation Age of Onset   Hypertension Father    Cancer Father        oral cancer   Leukemia Father        lymphocytic   Dementia Father    Lung cancer Maternal Grandmother     Social History   Socioeconomic History   Marital status:  Married    Spouse name: Not on file   Number of children: Not on file   Years of education: Not on file   Highest education level: Not on file  Occupational History   Not on file  Tobacco Use   Smoking status: Never   Smokeless tobacco: Never  Vaping Use   Vaping Use: Never used  Substance and Sexual Activity   Alcohol use: Yes    Alcohol/week: 1.0 standard drink    Types: 1 Glasses of wine per week   Drug use: No   Sexual activity: Yes    Partners: Male    Birth control/protection: Other-see comments    Comment: vasectomy  Other Topics Concern   Not on file  Social History Narrative   Not on file   Social Determinants of Health   Financial Resource Strain: Not on file  Food Insecurity: Not on file  Transportation Needs: Not on file  Physical Activity: Not on file  Stress:  Not on file  Social Connections: Not on file  Intimate Partner Violence: Not on file    Review of Systems  PHYSICAL EXAMINATION:    Ht (P) 5' 6.5" (1.689 m)   Wt (P) 149 lb (67.6 kg)   BMI (P) 23.69 kg/m     General appearance: appears stated age, interactive.   ASSESSMENT  Anxiety and panic.  Improved with Paxil.  Encounter for medication monitoring.   PLAN   Continue Paxil 10 mg daily.  Brochure given for [facility] counseling.  I also discussed other counseling options.  Fu for annual exam and prn.    An After Visit Summary was printed and given to the patient.  24 min  total time was spent for this patient encounter, including preparation, face-to-face counseling with the patient, coordination of care, and documentation of the encounter.

## 2021-03-04 ENCOUNTER — Encounter: Payer: Self-pay | Admitting: Obstetrics and Gynecology

## 2021-03-27 ENCOUNTER — Other Ambulatory Visit: Payer: Self-pay | Admitting: Obstetrics and Gynecology

## 2021-03-28 NOTE — Telephone Encounter (Signed)
Last annual exam was on 10/21 Annual exam scheduled on 05/22/21

## 2021-05-08 ENCOUNTER — Other Ambulatory Visit: Payer: Self-pay | Admitting: Obstetrics and Gynecology

## 2021-05-22 ENCOUNTER — Ambulatory Visit: Payer: 59 | Admitting: Obstetrics and Gynecology

## 2021-08-09 ENCOUNTER — Ambulatory Visit: Payer: 59 | Admitting: Obstetrics and Gynecology

## 2021-08-09 NOTE — Progress Notes (Signed)
41 y.o. G9P2002 Married Caucasian female here for annual exam.    Regular menses.   Taking Paxil for anxiety, which is better.  Taking Xanax but not frequently.  Wants to continue on current dosage.  PCP:   None  Patient's last menstrual period was 07/21/2021 (exact date).     Period Cycle (Days): 30 Period Duration (Days): 4 Period Pattern: Regular Menstrual Flow: Light (first day heavy then tapers) Menstrual Control: Maxi pad Menstrual Control Change Freq (Hours): changes pad every 4-6 hours on heaviest day Dysmenorrhea: (!) Mild Dysmenorrhea Symptoms: Cramping     Sexually active: Yes.    The current method of family planning is vasectomy.    Exercising: No.   Walking when she can Smoker:  no  Health Maintenance: Pap:   01-23-18 Neg:Neg HR HPV, 07-19-15 Neg, 06-15-13 Neg:Neg HR HPV History of abnormal Pap:  no MMG:  n/a Colonoscopy:  n/a BMD:   n/a  Result  n/a TDaP:  2014 Gardasil:   yes DUK:GURKYHC blood Hep C:donates blood Screening Labs:   today. Flu vaccine:  today.  Covid vaccine:  original series completed.    reports that she has never smoked. She has never used smokeless tobacco. She reports current alcohol use of about 1.0 standard drink per week. She reports that she does not use drugs.  History reviewed. No pertinent past medical history.  History reviewed. No pertinent surgical history.  Current Outpatient Medications  Medication Sig Dispense Refill   ALPRAZolam (XANAX) 0.25 MG tablet Take 1 tablet (0.25 mg total) by mouth 3 (three) times daily as needed for anxiety. 30 tablet 1   Calcium-Magnesium-Vitamin D (CALCIUM 500 PO) Take by mouth.     Multiple Vitamins-Minerals (MULTI COMPLETE PO) Take by mouth.     PARoxetine (PAXIL) 10 MG tablet TAKE 1 TABLET (10 MG TOTAL) BY MOUTH EVERY MORNING. 30 tablet 1   tretinoin (ALTRALIN) 0.05 % gel SMARTSIG:1 Topical Every Night     spironolactone (ALDACTONE) 50 MG tablet Take 50 mg by mouth 2 (two) times daily.  (Patient not taking: Reported on 08/13/2021)     No current facility-administered medications for this visit.    Family History  Problem Relation Age of Onset   Hypertension Father    Cancer Father        oral cancer   Leukemia Father        lymphocytic   Dementia Father    Lung cancer Maternal Grandmother     Review of Systems  All other systems reviewed and are negative.  Exam:   BP 122/74    Pulse 100    Ht 5\' 7"  (1.702 m)    Wt 165 lb (74.8 kg)    LMP 07/21/2021 (Exact Date)    SpO2 98%    BMI 25.84 kg/m     General appearance: alert, cooperative and appears stated age Head: normocephalic, without obvious abnormality, atraumatic Neck: no adenopathy, supple, symmetrical, trachea midline and thyroid normal to inspection and palpation Lungs: clear to auscultation bilaterally Breasts: normal appearance, no masses or tenderness, No nipple retraction or dimpling, No nipple discharge or bleeding, No axillary adenopathy Heart: regular rate and rhythm Abdomen: soft, non-tender; no masses, no organomegaly Extremities: extremities normal, atraumatic, no cyanosis or edema Skin: skin color, texture, turgor normal. No rashes or lesions Lymph nodes: cervical, supraclavicular, and axillary nodes normal. Neurologic: grossly normal  Pelvic: External genitalia:  no lesions              No  abnormal inguinal nodes palpated.              Urethra:  normal appearing urethra with no masses, tenderness or lesions              Bartholins and Skenes: normal                 Vagina: normal appearing vagina with normal color and discharge, no lesions              Cervix: no lesions              Pap taken: no Bimanual Exam:  Uterus:  normal size, contour, position, consistency, mobility, non-tender              Adnexa: no mass, fullness, tenderness           Chaperone was present for exam:  Marchelle Folks, CMA  Assessment:   Well woman visit with gynecologic exam. Anxiety, controlled on Paxil and prn  Xanax.  Plan: Mammogram screening discussed. Self breast awareness reviewed. Pap and HR HPV as above. Guidelines for Calcium, Vitamin D, regular exercise program including cardiovascular and weight bearing exercise. Refill of Paxil 10 mg daily.  #90, RF 3. Routine labs today.   Flu vaccine.  Follow up annually and prn.   After visit summary provided.

## 2021-08-13 ENCOUNTER — Encounter: Payer: Self-pay | Admitting: Obstetrics and Gynecology

## 2021-08-13 ENCOUNTER — Other Ambulatory Visit: Payer: Self-pay

## 2021-08-13 ENCOUNTER — Ambulatory Visit (INDEPENDENT_AMBULATORY_CARE_PROVIDER_SITE_OTHER): Payer: 59 | Admitting: Obstetrics and Gynecology

## 2021-08-13 VITALS — BP 122/74 | HR 100 | Ht 67.0 in | Wt 165.0 lb

## 2021-08-13 DIAGNOSIS — Z0000 Encounter for general adult medical examination without abnormal findings: Secondary | ICD-10-CM

## 2021-08-13 DIAGNOSIS — Z23 Encounter for immunization: Secondary | ICD-10-CM

## 2021-08-13 DIAGNOSIS — Z01419 Encounter for gynecological examination (general) (routine) without abnormal findings: Secondary | ICD-10-CM | POA: Diagnosis not present

## 2021-08-13 LAB — COMPREHENSIVE METABOLIC PANEL
AG Ratio: 1.7 (calc) (ref 1.0–2.5)
ALT: 11 U/L (ref 6–29)
AST: 12 U/L (ref 10–30)
Albumin: 4.3 g/dL (ref 3.6–5.1)
Alkaline phosphatase (APISO): 57 U/L (ref 31–125)
BUN: 14 mg/dL (ref 7–25)
CO2: 26 mmol/L (ref 20–32)
Calcium: 9 mg/dL (ref 8.6–10.2)
Chloride: 104 mmol/L (ref 98–110)
Creat: 0.89 mg/dL (ref 0.50–0.97)
Globulin: 2.5 g/dL (calc) (ref 1.9–3.7)
Glucose, Bld: 98 mg/dL (ref 65–99)
Potassium: 4.1 mmol/L (ref 3.5–5.3)
Sodium: 137 mmol/L (ref 135–146)
Total Bilirubin: 1.1 mg/dL (ref 0.2–1.2)
Total Protein: 6.8 g/dL (ref 6.1–8.1)

## 2021-08-13 LAB — CBC
HCT: 43 % (ref 35.0–45.0)
Hemoglobin: 14.6 g/dL (ref 11.7–15.5)
MCH: 30.6 pg (ref 27.0–33.0)
MCHC: 34 g/dL (ref 32.0–36.0)
MCV: 90.1 fL (ref 80.0–100.0)
MPV: 11.4 fL (ref 7.5–12.5)
Platelets: 268 10*3/uL (ref 140–400)
RBC: 4.77 10*6/uL (ref 3.80–5.10)
RDW: 12.5 % (ref 11.0–15.0)
WBC: 6.1 10*3/uL (ref 3.8–10.8)

## 2021-08-13 LAB — LIPID PANEL
Cholesterol: 179 mg/dL (ref <200)
HDL: 74 mg/dL (ref >=50)
LDL Cholesterol (Calc): 91 mg/dL (calc)
Non-HDL Cholesterol (Calc): 105 mg/dL (calc) (ref <130)
Total CHOL/HDL Ratio: 2.4 (calc) (ref <5.0)
Triglycerides: 49 mg/dL (ref <150)

## 2021-08-13 LAB — TSH: TSH: 1.56 mIU/L

## 2021-08-13 MED ORDER — PAROXETINE HCL 10 MG PO TABS: 10.0000 mg | ORAL_TABLET | ORAL | 3 refills | Status: DC

## 2021-08-13 NOTE — Patient Instructions (Signed)

## 2021-09-18 ENCOUNTER — Telehealth: Payer: Self-pay

## 2021-09-18 MED ORDER — ALPRAZOLAM 0.25 MG PO TABS: 0.2500 mg | ORAL_TABLET | Freq: Three times a day (TID) | ORAL | 0 refills | Status: DC | PRN

## 2021-09-18 NOTE — Telephone Encounter (Signed)
Patient called requesting refill on Xanax prescription as she "has a business trip coming up and needs to be sure I have what I need in case of stress". ? ?AEX 08/13/21. ?

## 2021-09-18 NOTE — Telephone Encounter (Signed)
Rx approved by me. ?

## 2021-09-18 NOTE — Telephone Encounter (Signed)
Per DPR access note on file left message for patient that Rx sent. ?

## 2021-12-11 ENCOUNTER — Telehealth: Payer: Self-pay | Admitting: *Deleted

## 2021-12-11 NOTE — Telephone Encounter (Signed)
She can switch from Paxil to Lexapro.   She may be able to just immediately go from Paxil 10 mg one day to Lexapro 10 mg the next day, but do it on a weekend in case she notices some differences in the medication.   Lexapro 10 mg Sig:  one po q day.  Disp:  30 RF: 2  Please have her let me know how she is doing after being on the Lexapro for 6 weeks.

## 2021-12-11 NOTE — Telephone Encounter (Signed)
Patient called  has been taking Paxil 10 mg since Feb 2023, called asking if Rx could be switched to maybe Lexapro? Patient reports medication makes her sleepy. Please advise

## 2021-12-11 NOTE — Telephone Encounter (Signed)
Left message for patient to call.

## 2021-12-19 MED ORDER — ESCITALOPRAM OXALATE 10 MG PO TABS: 10.0000 mg | ORAL_TABLET | Freq: Every day | ORAL | 0 refills | Status: DC

## 2021-12-19 NOTE — Telephone Encounter (Signed)
Left message for patient to call.

## 2021-12-19 NOTE — Telephone Encounter (Signed)
Patient called in triage voice mail. I called her back and received her voice mail and left message to call back.

## 2021-12-19 NOTE — Telephone Encounter (Signed)
Patient asked if Rx could be sent to [facility]. #90 sent only for patient.

## 2022-02-24 ENCOUNTER — Other Ambulatory Visit: Payer: Self-pay | Admitting: Obstetrics and Gynecology

## 2022-02-25 NOTE — Telephone Encounter (Signed)
Per telephone encounter 12/11/2021 Pt had switched from Paxil to Lexapro. JW sent in #90 w/ 0RF. Per Dr. Edward Jolly for pt to let us know how she is doing on Lexapro.   Last AEX 08/13/2021.  Called pt and left detailed VM on her machine per DPR for her to call back and let us know how she is doing with new Rx.

## 2022-02-27 ENCOUNTER — Other Ambulatory Visit: Payer: Self-pay | Admitting: Obstetrics and Gynecology

## 2022-07-31 NOTE — Progress Notes (Deleted)
42 y.o. G49P2002 Married Caucasian female here for annual exam.    PCP:     No LMP recorded.           Sexually active: {yes no:314532}  The current method of family planning is {contraception:315051}.    Exercising: {yes no:314532}  {types:19826} Smoker:  {YES [id number]  Health Maintenance: Pap:  01/23/18 neg: HR HPV neg, 07/19/15  neg History of abnormal Pap:  {YES NO:22349} MMG:  n/a Colonoscopy:  n/a BMD:   n/a  Result  n/a TDaP:  *** Gardasil:   {YES NO:22349} HIV: Hep C: Screening Labs:  Hb today: ***, Urine today: ***   reports that she has never smoked. She has never used smokeless tobacco. She reports current alcohol use of about 1.0 standard drink of alcohol per week. She reports that she does not use drugs.  No past medical history on file.  No past surgical history on file.  Current Outpatient Medications  Medication Sig Dispense Refill   escitalopram (LEXAPRO) 10 MG tablet Take 1.5 tablets (15 mg) by mouth daily. 135 tablet 0   ALPRAZolam (XANAX) 0.25 MG tablet Take 1 tablet (0.25 mg total) by mouth 3 (three) times daily as needed for anxiety. 30 tablet 0   Calcium-Magnesium-Vitamin D (CALCIUM 500 PO) Take by mouth.     Multiple Vitamins-Minerals (MULTI COMPLETE PO) Take by mouth.     spironolactone (ALDACTONE) 50 MG tablet Take 50 mg by mouth 2 (two) times daily. (Patient not taking: Reported on 08/13/2021)     tretinoin (ALTRALIN) 0.05 % gel SMARTSIG:1 Topical Every Night     No current facility-administered medications for this visit.    Family History  Problem Relation Age of Onset   Hypertension Father    Cancer Father        oral cancer   Leukemia Father        lymphocytic   Dementia Father    Lung cancer Maternal Grandmother     Review of Systems  Exam:   There were no vitals taken for this visit.    General appearance: alert, cooperative and appears stated age Head: normocephalic, without obvious abnormality, atraumatic Neck: no adenopathy,  supple, symmetrical, trachea midline and thyroid normal to inspection and palpation Lungs: clear to auscultation bilaterally Breasts: normal appearance, no masses or tenderness, No nipple retraction or dimpling, No nipple discharge or bleeding, No axillary adenopathy Heart: regular rate and rhythm Abdomen: soft, non-tender; no masses, no organomegaly Extremities: extremities normal, atraumatic, no cyanosis or edema Skin: skin color, texture, turgor normal. No rashes or lesions Lymph nodes: cervical, supraclavicular, and axillary nodes normal. Neurologic: grossly normal  Pelvic: External genitalia:  no lesions              No abnormal inguinal nodes palpated.              Urethra:  normal appearing urethra with no masses, tenderness or lesions              Bartholins and Skenes: normal                 Vagina: normal appearing vagina with normal color and discharge, no lesions              Cervix: no lesions              Pap taken: {yes no:314532} Bimanual Exam:  Uterus:  normal size, contour, position, consistency, mobility, non-tender  Adnexa: no mass, fullness, tenderness              Rectal exam: {yes no:314532}.  Confirms.              Anus:  normal sphincter tone, no lesions  Chaperone was present for exam:  ***  Assessment:   Well woman visit with gynecologic exam.   Plan: Mammogram screening discussed. Self breast awareness reviewed. Pap and HR HPV as above. Guidelines for Calcium, Vitamin D, regular exercise program including cardiovascular and weight bearing exercise.   Follow up annually and prn.   Additional counseling given.  {yes [id number]. _______ minutes face to face time of which over 50% was spent in counseling.    After visit summary provided.

## 2022-08-14 ENCOUNTER — Ambulatory Visit: Payer: 59 | Admitting: Obstetrics and Gynecology

## 2022-08-26 ENCOUNTER — Telehealth: Payer: Self-pay

## 2022-08-26 ENCOUNTER — Encounter: Payer: Self-pay | Admitting: Obstetrics and Gynecology

## 2022-08-26 ENCOUNTER — Other Ambulatory Visit: Payer: Self-pay | Admitting: Obstetrics and Gynecology

## 2022-08-26 MED ORDER — ALPRAZOLAM 0.25 MG PO TABS: 0.2500 mg | ORAL_TABLET | Freq: Three times a day (TID) | ORAL | 0 refills | Status: DC | PRN

## 2022-08-26 NOTE — Telephone Encounter (Signed)
Refill given for limited prescription of Xanax.

## 2022-08-26 NOTE — Telephone Encounter (Signed)
Patient called to ask if Dr. Quincy Simmonds would refill her Xanax for her. Has a trip coming up and feels better having it on hand.  AEX 08/13/21 Scheduled 01/14/23

## 2022-08-26 NOTE — Telephone Encounter (Signed)
Detailed message left in voice mail that Rx sent.

## 2022-10-26 ENCOUNTER — Other Ambulatory Visit: Payer: Self-pay | Admitting: Obstetrics and Gynecology

## 2022-12-31 NOTE — Progress Notes (Deleted)
42 y.o. G47P2002 Married Caucasian female here for annual exam.    PCP:     No LMP recorded.           Sexually active: {yes no:314532}  The current method of family planning is vasectomy.    Exercising: {yes no:314532}  {types:19826} Smoker:  no  Health Maintenance: Pap:  01-23-18 Neg:Neg HR HPV, 07-19-15 Neg, 06-15-13 Neg:Neg HR HPV  History of abnormal Pap:  no MMG:  n/a Colonoscopy:  n/a BMD:   n/a  Result  n/a TDaP:  2014 Gardasil:   yes HIV: donates blood Hep C: donates blood Screening Labs:  Hb today: ***, Urine today: ***   reports that she has never smoked. She has never used smokeless tobacco. She reports current alcohol use of about 1.0 standard drink of alcohol per week. She reports that she does not use drugs.  Past Medical History:  Diagnosis Date   Anxiety     No past surgical history on file.  Current Outpatient Medications  Medication Sig Dispense Refill   escitalopram (LEXAPRO) 10 MG tablet Take 1.5 tablets (15 mg) by mouth daily. 135 tablet 0   ALPRAZolam (XANAX) 0.25 MG tablet Take 1 tablet (0.25 mg total) by mouth 3 (three) times daily as needed for anxiety. 15 tablet 0   Calcium-Magnesium-Vitamin D (CALCIUM 500 PO) Take by mouth.     Multiple Vitamins-Minerals (MULTI COMPLETE PO) Take by mouth.     spironolactone (ALDACTONE) 50 MG tablet Take 50 mg by mouth 2 (two) times daily. (Patient not taking: Reported on 08/13/2021)     tretinoin (ALTRALIN) 0.05 % gel SMARTSIG:1 Topical Every Night     No current facility-administered medications for this visit.    Family History  Problem Relation Age of Onset   Hypertension Father    Cancer Father        oral cancer   Leukemia Father        lymphocytic   Dementia Father    Lung cancer Maternal Grandmother     Review of Systems  Exam:   There were no vitals taken for this visit.    General appearance: alert, cooperative and appears stated age Head: normocephalic, without obvious abnormality,  atraumatic Neck: no adenopathy, supple, symmetrical, trachea midline and thyroid normal to inspection and palpation Lungs: clear to auscultation bilaterally Breasts: normal appearance, no masses or tenderness, No nipple retraction or dimpling, No nipple discharge or bleeding, No axillary adenopathy Heart: regular rate and rhythm Abdomen: soft, non-tender; no masses, no organomegaly Extremities: extremities normal, atraumatic, no cyanosis or edema Skin: skin color, texture, turgor normal. No rashes or lesions Lymph nodes: cervical, supraclavicular, and axillary nodes normal. Neurologic: grossly normal  Pelvic: External genitalia:  no lesions              No abnormal inguinal nodes palpated.              Urethra:  normal appearing urethra with no masses, tenderness or lesions              Bartholins and Skenes: normal                 Vagina: normal appearing vagina with normal color and discharge, no lesions              Cervix: no lesions              Pap taken: {yes no:314532} Bimanual Exam:  Uterus:  normal size, contour, position, consistency, mobility, non-tender  Adnexa: no mass, fullness, tenderness              Rectal exam: {yes no:314532}.  Confirms.              Anus:  normal sphincter tone, no lesions  Chaperone was present for exam:  ***  Assessment:   Well woman visit with gynecologic exam.   Plan: Mammogram screening discussed. Self breast awareness reviewed. Pap and HR HPV as above. Guidelines for Calcium, Vitamin D, regular exercise program including cardiovascular and weight bearing exercise.   Follow up annually and prn.   Additional counseling given.  {yes [id number]. _______ minutes face to face time of which over 50% was spent in counseling.    After visit summary provided.    '

## 2023-01-14 ENCOUNTER — Ambulatory Visit: Payer: 59 | Admitting: Obstetrics and Gynecology

## 2023-03-11 ENCOUNTER — Other Ambulatory Visit: Payer: Self-pay | Admitting: *Deleted

## 2023-03-11 MED ORDER — ALPRAZOLAM 0.25 MG PO TABS: 0.2500 mg | ORAL_TABLET | Freq: Three times a day (TID) | ORAL | 0 refills | Status: DC | PRN

## 2023-03-11 NOTE — Telephone Encounter (Signed)
Med refill request: Xanax 0.25 mg tab PO tid PRN for anxiety  Patient left voicemail requesting refill, has a trip coming up.    Last AEX: 08/13/21 -BS Next AEX: 05/05/23 -BS  Last MMG (if hormonal med) N/A  Last filled 08/26/22  Refill authorized: Please Advise?

## 2023-03-12 ENCOUNTER — Emergency Department (HOSPITAL_COMMUNITY)
Admission: EM | Admit: 2023-03-12 | Discharge: 2023-03-12 | Disposition: A | Discharge status: Home / Self Care | Payer: 59 | Attending: Emergency Medicine | Admitting: Emergency Medicine

## 2023-03-12 ENCOUNTER — Encounter (HOSPITAL_COMMUNITY): Payer: Self-pay | Admitting: Emergency Medicine

## 2023-03-12 ENCOUNTER — Other Ambulatory Visit: Payer: Self-pay

## 2023-03-12 DIAGNOSIS — Y909 Presence of alcohol in blood, level not specified: Secondary | ICD-10-CM | POA: Insufficient documentation

## 2023-03-12 DIAGNOSIS — F419 Anxiety disorder, unspecified: Secondary | ICD-10-CM | POA: Diagnosis not present

## 2023-03-12 DIAGNOSIS — R000 Tachycardia, unspecified: Secondary | ICD-10-CM | POA: Insufficient documentation

## 2023-03-12 DIAGNOSIS — F1010 Alcohol abuse, uncomplicated: Secondary | ICD-10-CM | POA: Diagnosis not present

## 2023-03-12 DIAGNOSIS — F410 Panic disorder [episodic paroxysmal anxiety] without agoraphobia: Secondary | ICD-10-CM | POA: Diagnosis present

## 2023-03-12 LAB — CBC WITH DIFFERENTIAL/PLATELET
Abs Immature Granulocytes: 0.03 10*3/uL (ref 0.00–0.07)
Basophils Absolute: 0.1 10*3/uL (ref 0.0–0.1)
Basophils Relative: 1 %
Eosinophils Absolute: 0.1 10*3/uL (ref 0.0–0.5)
Eosinophils Relative: 1 %
HCT: 39.8 % (ref 36.0–46.0)
Hemoglobin: 13.8 g/dL (ref 12.0–15.0)
Immature Granulocytes: 0 %
Lymphocytes Relative: 17 %
Lymphs Abs: 1.6 10*3/uL (ref 0.7–4.0)
MCH: 30.7 pg (ref 26.0–34.0)
MCHC: 34.7 g/dL (ref 30.0–36.0)
MCV: 88.6 fL (ref 80.0–100.0)
Monocytes Absolute: 0.7 10*3/uL (ref 0.1–1.0)
Monocytes Relative: 8 %
Neutro Abs: 6.7 10*3/uL (ref 1.7–7.7)
Neutrophils Relative %: 73 %
Platelets: 235 10*3/uL (ref 150–400)
RBC: 4.49 MIL/uL (ref 3.87–5.11)
RDW: 11.9 % (ref 11.5–15.5)
WBC: 9.1 10*3/uL (ref 4.0–10.5)
nRBC: 0 % (ref 0.0–0.2)

## 2023-03-12 LAB — TSH: TSH: 2.638 u[IU]/mL (ref 0.350–4.500)

## 2023-03-12 LAB — COMPREHENSIVE METABOLIC PANEL
ALT: 21 U/L (ref 0–44)
AST: 47 U/L — ABNORMAL HIGH (ref 15–41)
Albumin: 4 g/dL (ref 3.5–5.0)
Alkaline Phosphatase: 66 U/L (ref 38–126)
Anion gap: 9 (ref 5–15)
BUN: 11 mg/dL (ref 6–20)
CO2: 19 mmol/L — ABNORMAL LOW (ref 22–32)
Calcium: 8.9 mg/dL (ref 8.9–10.3)
Chloride: 109 mmol/L (ref 98–111)
Creatinine, Ser: 0.61 mg/dL (ref 0.44–1.00)
GFR, Estimated: >60 mL/min (ref >60)
Glucose, Bld: 110 mg/dL — ABNORMAL HIGH (ref 70–99)
Potassium: 3.3 mmol/L — ABNORMAL LOW (ref 3.5–5.1)
Sodium: 137 mmol/L (ref 135–145)
Total Bilirubin: 1.3 mg/dL — ABNORMAL HIGH (ref 0.3–1.2)
Total Protein: 7.2 g/dL (ref 6.5–8.1)

## 2023-03-12 LAB — LIPASE, BLOOD: Lipase: 32 U/L (ref 11–51)

## 2023-03-12 MED ORDER — CHLORDIAZEPOXIDE HCL 25 MG PO CAPS: ORAL_CAPSULE | ORAL | 0 refills | Status: DC

## 2023-03-12 MED ORDER — LORAZEPAM 1 MG PO TABS
1.0000 mg | ORAL_TABLET | Freq: Once | ORAL | Status: AC
  Administered 2023-03-12: 1 mg via ORAL
  Filled 2023-03-12: qty 1

## 2023-03-12 NOTE — ED Provider Notes (Signed)
[facility] EMERGENCY DEPARTMENT AT [facility] Provider Note   CSN: [id number] Arrival date & time: 03/12/23  7829     History  Chief Complaint  Patient presents with   Panic Attack   Alcohol Problem    Victoria Nash is a 42 y.o. female.  Patient reports that she feels like she is having a panic attack this morning.  Patient reports she is wanting to stop drinking.  Patient states that she did drink a bottle of wine last p.m.  Patient reports today she does not feel well.  Patient complains of feeling shaky.  Patient reports her heart has been racing.  Patient reports taking a Xanax 0.25 without relief.  Patient thinks her symptoms are caused from anxiety.  Patient is anxious because she is concerned that she drinks too much alcohol and wants to stop.  The history is provided by the patient. No language interpreter was used.  Alcohol Problem This is a new problem. The problem occurs constantly. The problem has not changed since onset.Pertinent negatives include no chest pain and no abdominal pain. Nothing aggravates the symptoms. She has tried nothing for the symptoms.       Home Medications Prior to Admission medications   Medication Sig Start Date End Date Taking? Authorizing Provider  escitalopram (LEXAPRO) 10 MG tablet Take 1.5 tablets (15 mg) by mouth daily. 02/27/22   Patton Salles, MD  ALPRAZolam Prudy Feeler) 0.25 MG tablet Take 1 tablet (0.25 mg total) by mouth 3 (three) times daily as needed for anxiety. 03/11/23   Patton Salles, MD  Calcium-Magnesium-Vitamin D (CALCIUM 500 PO) Take by mouth.    [provider]  Multiple Vitamins-Minerals (MULTI COMPLETE PO) Take by mouth.    [provider]  spironolactone (ALDACTONE) 50 MG tablet Take 50 mg by mouth 2 (two) times daily. Patient not taking: Reported on 08/13/2021 03/31/20   [provider]  tretinoin (ALTRALIN) 0.05 % gel SMARTSIG:1 Topical Every Night 12/17/19    [provider]      Allergies    Patient has no known allergies.    Review of Systems   Review of Systems  Cardiovascular:  Negative for chest pain.  Gastrointestinal:  Negative for abdominal pain.  All other systems reviewed and are negative.   Physical Exam Updated Vital Signs BP (!) 131/118   Pulse (!) 130   Temp 97.8 F (36.6 C) (Oral)   Resp 18   Ht 5\' 7"  (1.702 m)   Wt 77.1 kg   SpO2 100%   BMI 26.63 kg/m  Physical Exam Vitals and nursing note reviewed.  Constitutional:      Appearance: She is well-developed.  HENT:     Head: Normocephalic.     Mouth/Throat:     Mouth: Mucous membranes are moist.  Eyes:     Extraocular Movements: Extraocular movements intact.     Pupils: Pupils are equal, round, and reactive to light.  Cardiovascular:     Rate and Rhythm: Tachycardia present.  Pulmonary:     Effort: Pulmonary effort is normal.  Abdominal:     General: There is no distension.  Musculoskeletal:        General: Normal range of motion.     Cervical back: Normal range of motion.  Skin:    General: Skin is warm.  Neurological:     General: No focal deficit present.     Mental Status: She is alert and oriented to  person, place, and time.     ED Results / Procedures / Treatments   Labs (all labs ordered are listed, but only abnormal results are displayed) Labs Reviewed  COMPREHENSIVE METABOLIC PANEL - Abnormal; Notable for the following components:      Result Value   Potassium 3.3 (*)    CO2 19 (*)    Glucose, Bld 110 (*)    AST 47 (*)    Total Bilirubin 1.3 (*)    All other components within normal limits  CBC WITH DIFFERENTIAL/PLATELET  LIPASE, BLOOD  TSH    EKG None  Radiology No results found.  Procedures Procedures    Medications Ordered in ED Medications  LORazepam (ATIVAN) tablet 1 mg (has no administration in time range)    ED Course/ Medical Decision Making/ A&P                                 Medical Decision  Making Patient complains of anxiety.  Patient reports she is trying to stop drinking.  She did drink a bottle of wine last p.m.  Patient states she feels like she is having an anxiety attack.  Patient reports her heart has been beating fast and she has felt shaky today  Amount and/or Complexity of Data Reviewed Labs: ordered. Decision-making details documented in ED Course.    Details: Labs ordered reviewed and interpreted ECG/medicine tests: ordered and independent interpretation performed. Decision-making details documented in ED Course.    Details: EKG shows a sinus tachycardia otherwise normal EKG  Risk Prescription drug management. Risk Details: Patient given Ativan p.o. here.  She reports feeling much better patient's heart rate has improved to the mid 90s.  I discussed patient's symptoms with her I will try her on a Librium taper.  Patient is advised to not take Xanax while she is on this medication.  Patient is given resources for alcohol treatment.  Patient is encouraged to get involved with a counselor or a substance abuse program to help her.  Patient agrees to plan she is feeling much better patient is discharged in stable condition           Final Clinical Impression(s) / ED Diagnoses Final diagnoses:  Alcohol abuse  Anxiety    Rx / DC Orders ED Discharge Orders     None     An After Visit Summary was printed and given to the patient.     Osie Cheeks 03/12/23 2956    Benjiman Core, MD 03/12/23 [phone]

## 2023-03-12 NOTE — Discharge Instructions (Addendum)
Return if any problems.  Stop taking xanax.

## 2023-03-12 NOTE — ED Triage Notes (Signed)
Patient coming to ED for evaluation.  States she is having a "panic attack that started yesterday morning."  Reports her wanting to quit drinking is what triggers her attack.  Took Xanax before coming.  Normally drinks one bottle of wine daily.  Has been drinking X years.  Has never tried to quit before.  No hx of DT or withdrawal.

## 2023-03-12 NOTE — ED Notes (Signed)
Pt resting in bed, in NAD, VSS, resp wdl. Pt calm and cooperative, A&Ox4. No obvious s/s of etoh abuse or withdrawal. Denies any needs at this time.

## 2023-03-30 ENCOUNTER — Other Ambulatory Visit: Payer: Self-pay

## 2023-03-30 ENCOUNTER — Ambulatory Visit (HOSPITAL_COMMUNITY)
Admission: EM | Admit: 2023-03-30 | Discharge: 2023-03-30 | Disposition: A | Discharge status: Other Acute Inpatient Hospital | Payer: 59 | Attending: Psychiatry | Admitting: Psychiatry

## 2023-03-30 ENCOUNTER — Other Ambulatory Visit (HOSPITAL_COMMUNITY)
Admission: EM | Admit: 2023-03-30 | Discharge: 2023-04-02 | Disposition: A | Discharge status: Home / Self Care | Payer: 59 | Attending: Psychiatry | Admitting: Psychiatry

## 2023-03-30 DIAGNOSIS — F10230 Alcohol dependence with withdrawal, uncomplicated: Secondary | ICD-10-CM

## 2023-03-30 DIAGNOSIS — F1094 Alcohol use, unspecified with alcohol-induced mood disorder: Secondary | ICD-10-CM | POA: Diagnosis not present

## 2023-03-30 DIAGNOSIS — F410 Panic disorder [episodic paroxysmal anxiety] without agoraphobia: Secondary | ICD-10-CM

## 2023-03-30 DIAGNOSIS — F411 Generalized anxiety disorder: Secondary | ICD-10-CM | POA: Insufficient documentation

## 2023-03-30 DIAGNOSIS — F430 Acute stress reaction: Secondary | ICD-10-CM | POA: Diagnosis present

## 2023-03-30 DIAGNOSIS — Z79899 Other long term (current) drug therapy: Secondary | ICD-10-CM | POA: Insufficient documentation

## 2023-03-30 DIAGNOSIS — F1020 Alcohol dependence, uncomplicated: Secondary | ICD-10-CM | POA: Diagnosis present

## 2023-03-30 LAB — COMPREHENSIVE METABOLIC PANEL
ALT: 18 U/L (ref 0–44)
AST: 24 U/L (ref 15–41)
Albumin: 4.4 g/dL (ref 3.5–5.0)
Alkaline Phosphatase: 74 U/L (ref 38–126)
Anion gap: 13 (ref 5–15)
BUN: <5 mg/dL — ABNORMAL LOW (ref 6–20)
CO2: 22 mmol/L (ref 22–32)
Calcium: 9.3 mg/dL (ref 8.9–10.3)
Chloride: 104 mmol/L (ref 98–111)
Creatinine, Ser: 0.71 mg/dL (ref 0.44–1.00)
GFR, Estimated: >60 mL/min (ref >60)
Glucose, Bld: 94 mg/dL (ref 70–99)
Potassium: 3.8 mmol/L (ref 3.5–5.1)
Sodium: 139 mmol/L (ref 135–145)
Total Bilirubin: 0.6 mg/dL (ref 0.3–1.2)
Total Protein: 7.9 g/dL (ref 6.5–8.1)

## 2023-03-30 LAB — LIPID PANEL
Cholesterol: 217 mg/dL — ABNORMAL HIGH (ref 0–200)
HDL: 89 mg/dL (ref >40)
LDL Cholesterol: 120 mg/dL — ABNORMAL HIGH (ref 0–99)
Total CHOL/HDL Ratio: 2.4 RATIO
Triglycerides: 41 mg/dL (ref <150)
VLDL: 8 mg/dL (ref 0–40)

## 2023-03-30 LAB — POCT URINE DRUG SCREEN - MANUAL ENTRY (I-SCREEN)
POC Amphetamine UR: NOT DETECTED
POC Buprenorphine (BUP): NOT DETECTED
POC Cocaine UR: NOT DETECTED
POC Marijuana UR: NOT DETECTED
POC Methadone UR: NOT DETECTED
POC Methamphetamine UR: NOT DETECTED
POC Morphine: NOT DETECTED
POC Oxazepam (BZO): POSITIVE — AB
POC Oxycodone UR: NOT DETECTED
POC Secobarbital (BAR): NOT DETECTED

## 2023-03-30 LAB — CBC WITH DIFFERENTIAL/PLATELET
Abs Immature Granulocytes: 0.05 10*3/uL (ref 0.00–0.07)
Basophils Absolute: 0.1 10*3/uL (ref 0.0–0.1)
Basophils Relative: 1 %
Eosinophils Absolute: 0 10*3/uL (ref 0.0–0.5)
Eosinophils Relative: 0 %
HCT: 45 % (ref 36.0–46.0)
Hemoglobin: 15.4 g/dL — ABNORMAL HIGH (ref 12.0–15.0)
Immature Granulocytes: 1 %
Lymphocytes Relative: 10 %
Lymphs Abs: 1.1 10*3/uL (ref 0.7–4.0)
MCH: 30.1 pg (ref 26.0–34.0)
MCHC: 34.2 g/dL (ref 30.0–36.0)
MCV: 87.9 fL (ref 80.0–100.0)
Monocytes Absolute: 0.5 10*3/uL (ref 0.1–1.0)
Monocytes Relative: 4 %
Neutro Abs: 9.4 10*3/uL — ABNORMAL HIGH (ref 1.7–7.7)
Neutrophils Relative %: 84 %
Platelets: 332 10*3/uL (ref 150–400)
RBC: 5.12 MIL/uL — ABNORMAL HIGH (ref 3.87–5.11)
RDW: 11.8 % (ref 11.5–15.5)
WBC: 11.1 10*3/uL — ABNORMAL HIGH (ref 4.0–10.5)
nRBC: 0 % (ref 0.0–0.2)

## 2023-03-30 LAB — POC URINE PREG, ED: Preg Test, Ur: NEGATIVE

## 2023-03-30 LAB — HEMOGLOBIN A1C
Hgb A1c MFr Bld: 5.1 % (ref 4.8–5.6)
Mean Plasma Glucose: 99.67 mg/dL

## 2023-03-30 LAB — ETHANOL: Alcohol, Ethyl (B): <10 mg/dL (ref <10)

## 2023-03-30 LAB — TSH: TSH: 0.823 u[IU]/mL (ref 0.350–4.500)

## 2023-03-30 MED ORDER — MAGNESIUM HYDROXIDE 400 MG/5ML PO SUSP: 30.0000 mL | Freq: Every day | ORAL | Status: DC | PRN

## 2023-03-30 MED ORDER — THIAMINE MONONITRATE 100 MG PO TABS: 100.0000 mg | ORAL_TABLET | Freq: Every day | ORAL | Status: DC

## 2023-03-30 MED ORDER — LOPERAMIDE HCL 2 MG PO CAPS: 2.0000 mg | ORAL_CAPSULE | ORAL | Status: DC | PRN

## 2023-03-30 MED ORDER — ESCITALOPRAM OXALATE 10 MG PO TABS: 10.0000 mg | ORAL_TABLET | Freq: Every day | ORAL | Status: DC

## 2023-03-30 MED ORDER — ALUM & MAG HYDROXIDE-SIMETH 200-200-20 MG/5ML PO SUSP: 30.0000 mL | ORAL | Status: DC | PRN

## 2023-03-30 MED ORDER — TRAZODONE HCL 50 MG PO TABS: 50.0000 mg | ORAL_TABLET | Freq: Every evening | ORAL | Status: DC | PRN

## 2023-03-30 MED ORDER — CHLORDIAZEPOXIDE HCL 25 MG PO CAPS
25.0000 mg | ORAL_CAPSULE | Freq: Every day | ORAL | Status: AC
  Administered 2023-04-02: 25 mg via ORAL
  Filled 2023-03-30: qty 1

## 2023-03-30 MED ORDER — LORAZEPAM 1 MG PO TABS
1.0000 mg | ORAL_TABLET | Freq: Four times a day (QID) | ORAL | Status: DC
  Administered 2023-03-30: 1 mg via ORAL
  Filled 2023-03-30: qty 1

## 2023-03-30 MED ORDER — ACETAMINOPHEN 325 MG PO TABS: 650.0000 mg | ORAL_TABLET | Freq: Four times a day (QID) | ORAL | Status: DC | PRN

## 2023-03-30 MED ORDER — LORAZEPAM 1 MG PO TABS: 1.0000 mg | ORAL_TABLET | Freq: Four times a day (QID) | ORAL | Status: DC | PRN

## 2023-03-30 MED ORDER — LORAZEPAM 1 MG PO TABS: 1.0000 mg | ORAL_TABLET | Freq: Two times a day (BID) | ORAL | Status: DC

## 2023-03-30 MED ORDER — ADULT MULTIVITAMIN W/MINERALS CH
1.0000 | ORAL_TABLET | Freq: Every day | ORAL | Status: DC
  Administered 2023-03-30: 1 via ORAL
  Filled 2023-03-30: qty 1

## 2023-03-30 MED ORDER — THIAMINE HCL 100 MG/ML IJ SOLN
100.0000 mg | Freq: Once | INTRAMUSCULAR | Status: AC
  Administered 2023-03-30: 100 mg via INTRAMUSCULAR
  Filled 2023-03-30: qty 2

## 2023-03-30 MED ORDER — LORAZEPAM 1 MG PO TABS: 1.0000 mg | ORAL_TABLET | Freq: Every day | ORAL | Status: DC

## 2023-03-30 MED ORDER — CHLORDIAZEPOXIDE HCL 25 MG PO CAPS: 25.0000 mg | ORAL_CAPSULE | Freq: Four times a day (QID) | ORAL | Status: DC | PRN

## 2023-03-30 MED ORDER — ADULT MULTIVITAMIN W/MINERALS CH
1.0000 | ORAL_TABLET | Freq: Every day | ORAL | Status: DC
  Administered 2023-03-31 – 2023-04-02 (×3): 1 via ORAL
  Filled 2023-03-30 (×3): qty 1

## 2023-03-30 MED ORDER — THIAMINE MONONITRATE 100 MG PO TABS
100.0000 mg | ORAL_TABLET | Freq: Every day | ORAL | Status: DC
  Administered 2023-03-31 – 2023-04-02 (×3): 100 mg via ORAL
  Filled 2023-03-30 (×3): qty 1

## 2023-03-30 MED ORDER — CHLORDIAZEPOXIDE HCL 25 MG PO CAPS
25.0000 mg | ORAL_CAPSULE | Freq: Three times a day (TID) | ORAL | Status: AC
  Administered 2023-03-31 (×3): 25 mg via ORAL
  Filled 2023-03-30 (×3): qty 1

## 2023-03-30 MED ORDER — ONDANSETRON 4 MG PO TBDP: 4.0000 mg | ORAL_TABLET | Freq: Four times a day (QID) | ORAL | Status: DC | PRN

## 2023-03-30 MED ORDER — HYDROXYZINE HCL 25 MG PO TABS: 25.0000 mg | ORAL_TABLET | Freq: Three times a day (TID) | ORAL | Status: DC | PRN

## 2023-03-30 MED ORDER — CHLORDIAZEPOXIDE HCL 25 MG PO CAPS
25.0000 mg | ORAL_CAPSULE | ORAL | Status: AC
  Administered 2023-04-01 (×2): 25 mg via ORAL
  Filled 2023-03-30 (×2): qty 1

## 2023-03-30 MED ORDER — HYDROXYZINE HCL 25 MG PO TABS
25.0000 mg | ORAL_TABLET | Freq: Three times a day (TID) | ORAL | Status: DC | PRN
  Administered 2023-03-30 – 2023-03-31 (×3): 25 mg via ORAL
  Filled 2023-03-30 (×3): qty 1

## 2023-03-30 MED ORDER — ESCITALOPRAM OXALATE 10 MG PO TABS
10.0000 mg | ORAL_TABLET | Freq: Every day | ORAL | Status: DC
  Administered 2023-03-31 – 2023-04-02 (×3): 10 mg via ORAL
  Filled 2023-03-30 (×3): qty 1

## 2023-03-30 MED ORDER — CHLORDIAZEPOXIDE HCL 25 MG PO CAPS
25.0000 mg | ORAL_CAPSULE | Freq: Four times a day (QID) | ORAL | Status: AC
  Administered 2023-03-30 (×3): 25 mg via ORAL
  Filled 2023-03-30 (×3): qty 1

## 2023-03-30 MED ORDER — LORAZEPAM 1 MG PO TABS: 1.0000 mg | ORAL_TABLET | Freq: Three times a day (TID) | ORAL | Status: DC

## 2023-03-30 NOTE — ED Notes (Signed)
Attempted to draw patients blood x2 but unable to get it. Patient states she drank some Gatorade this morning. Gave patient water and offered food. Patient resting in bed with no acute distress.

## 2023-03-30 NOTE — Progress Notes (Signed)
   03/30/23 0714  [facility] Triage Screening (Walk-ins at [facility] only)  How Did You Hear About [place]? Self  What Is the Reason for Your Visit/Call Today? Pt presents voluntarily to [facility] unaccompanied. Pt states that she is having a panic attack. Pt states that she wants to stop drinking and it causes her to panic. Pt states that she is afraid to go through withdrawals. Pt denies SI, HI, drug use and AVH at this current time. Pt admits that she had alcohol last night, which was a whole bottle of wine. Pt doens't have a therapist or psychiatrist at this time.  How Long Has This Been Causing You Problems? 1 wk - 1 month  Have You Recently Had Any Thoughts About Hurting Yourself? No  Are You Planning to Commit Suicide/Harm Yourself At This time? No  Have you Recently Had Thoughts About Hurting Someone [name]? No  Are You Planning To Harm Someone At This Time? No  Are you currently experiencing any auditory, visual or other hallucinations? No  Have You Used Any Alcohol or Drugs in the Past 24 Hours? Yes  How long ago did you use Drugs or Alcohol? last night  What Did You Use and How Much? wine - whole bottle  Do you have any current medical co-morbidities that require immediate attention? No  Clinician description of patient physical appearance/behavior: groomed, cooperative  What Do You Feel Would Help You the Most Today? Medication(s);Social Support  If access to [facility] was not available, would you have sought care in the Emergency Department? No  Determination of Need Routine (7 days)  Options For Referral Medication Management;Chemical Dependency Intensive Outpatient Therapy (CDIOP)

## 2023-03-30 NOTE — Group Note (Signed)
Group Topic: Communication  Group Date: 03/30/2023 Start Time: 2000 End Time: 2100 Facilitators: Rae Lips B  Department: [facility]  Number of Participants: 5  Group Focus: acceptance and activities of daily living skills Treatment Modality:  Individual Therapy Interventions utilized were leisure development Purpose: enhance coping skills and express feelings  Name: Victoria Nash Date of Birth: [birth year]  MR: [id number]    Level of Participation: PT DID NOT ATTEND GROUP Quality of Participation: NA Interactions with others: NA Mood/Affect: NA Triggers (if applicable): NA Cognition: NA Progress: Other Response: NA Plan: patient will be encouraged to to go to groups.   Patients Problems:  Patient Active Problem List   Diagnosis Date Noted   Alcohol dependence (HCC) 03/30/2023   GAD (generalized anxiety disorder) 04/19/2011

## 2023-03-30 NOTE — ED Notes (Signed)
Pt was sleeping upon writer entering room but easily aroused to name being called. Pt took scheduled medication without difficulty. Pt c/o mild anxiety but denied need for relief medication other than scheduled medication (Librium). Encouraged pt to practice deep breathing exercises to manage anxiety. Pt verbalized agreement. Pt states, "I like to read to distract my mind. Pt have a book by bedside. Praise given. Informed pt to notify staff with any needs or concerns. Will continue to monitor for safety.

## 2023-03-30 NOTE — ED Notes (Signed)
Husband took bag home and brought pt clothing.  Items placed in locker

## 2023-03-30 NOTE — ED Notes (Signed)
Pt is in the dayroom watching TV with peers. Pt denies SI/HI/AVH. No acute distress noted. Will continue to monitor for safety. 

## 2023-03-30 NOTE — ED Provider Notes (Signed)
Facility Based Crisis Admission H&P  Date: 03/30/23 Patient Name: Victoria Nash MRN: [id number] Chief Complaint:   Diagnoses:  Final diagnoses:  Alcohol dependence with uncomplicated withdrawal (HCC)  Panic attacks  Alcohol-induced mood disorder (HCC)    HPI: History of Present illness: Victoria Nash is a 42 y.o. female patient with a past history significant for alcohol dependence and panic attacks who presents to the [facility] behavioral health urgent care voluntary with c/o "having panic attacks trying to stop drinking."      Patient seen and evaluated face-to-face by this provider, and chart reviewed. Per chart review, patient was evaluated at the [facility] emergency department on 03/12/2023 with similar complaints of anxiety and alcohol abuse and was prescribed Librium taper 50 mg x 3 days. On evaluation, patient is alert and oriented x 4. Her thought process is linear and goal oriented. She denies SI/HI/AVH. There is no objective evidence that the patient is currently responding to internal or external stimuli. Her speech is clear and coherent. Her mood is anxious and affect is tearful. She has fair eye contact. She is casually dressed. She is cooperative and does not appear to be in acute distress. Patient states that she wants to quit drinking but is afraid that she is going to go into withdrawal and has started having panic attacks more frequently this past week. She describes her panic attacks as intense all the time, hot feeling, sick to her stomach, excessive worrying, dizziness, and palpitations. She identifies current triggers and stressors attributing to her symptoms as wanting to stop drinking and her 76 year old daughter just got out of an abusive relationship. She states that last week she went to the ED because she wanted to stop drinking and they gave her medication to help her stop drinking. She states that she finished the medication on Wednesday. She states that she did  not consume alcohol while taking the medication. She states that she started back drinking a week later on Wednesday "because it's a habit." She states that she started drinking intermittently in high school. She reports drinking everyday since COVID, on average 1 or 2 bottles of wine per day. She states that she last consumed alcohol last night. She reports withdrawal symptoms of anxiety, nausea, and loose stools. She denies using illicit drugs. She denies past history of alcohol withdrawal seizures, or DT's. She denies past substance abuse treatment or outpatient psychiatry/therapy. She states that in the past her gynecologist prescribed her Xanax for panic attacks. She reports a family psychiatric history of brother died this year from alcoholism and grandfather hx of alcoholism. She resides with her husband, and 2 daughters ages 68 and 40 years old. She denies access to firearms in the home. She is employed full-time remotely as a Production designer, theatre/television/film. She denies a medical history. She reports taking Lexapro 10 mg po daily for panic attacks prescribed by her gynecologist.    PHQ 2-9:  Flowsheet Row ED from 03/30/2023 in [facility]  Thoughts that you would be better off dead, or of hurting yourself in some way Not at all  PHQ-9 Total Score 18       Flowsheet Row ED from 03/30/2023 in [facility] Most recent reading at 03/30/2023  2:25 PM ED from 03/30/2023 in [facility] Most recent reading at 03/30/2023  7:23 AM ED from 03/12/2023 in [facility] Emergency Department at [facility] Most recent reading at 03/12/2023  6:36  AM  C-SSRS RISK CATEGORY No Risk No Risk No Risk         Total Time spent with patient: 20 minutes  Musculoskeletal  Strength & Muscle Tone: within normal limits Gait & Station: normal Patient leans: N/A  Psychiatric Specialty Exam  Presentation General Appearance:  Appropriate  for Environment  Eye Contact: Fair  Speech: Clear and Coherent  Speech Volume: Normal  Handedness: Right   Mood and Affect  Mood: Anxious  Affect: Congruent   Thought Process  Thought Processes: Coherent  Descriptions of Associations:Intact  Orientation:Full (Time, Place and Person)  Thought Content:Logical  Diagnosis of Schizophrenia or Schizoaffective disorder in past: No   Hallucinations:Hallucinations: None  Ideas of Reference:None  Suicidal Thoughts:Suicidal Thoughts: No  Homicidal Thoughts:Homicidal Thoughts: No   Sensorium  Memory: Immediate Fair; Recent Fair; Remote Fair  Judgment: Fair  Insight: Fair   Art therapist  Concentration: Fair  Attention Span: Fair  Recall: [employer] of Knowledge: Fair  Language: Fair   Psychomotor Activity  Psychomotor Activity: Psychomotor Activity: Normal   Assets  Assets: Communication Skills; Desire for Improvement; Financial Resources/Insurance; Housing; Intimacy; Leisure Time; Physical Health; Resilience; Social Support; Talents/Skills; Transportation; Vocational/Educational   Sleep  Sleep: Sleep: Poor Number of Hours of Sleep: 6   Nutritional Assessment (For OBS and FBC admissions only) Has the patient had a weight loss or gain of 10 pounds or more in the last 3 months?: No Has the patient had a decrease in food intake/or appetite?: Yes Does the patient have dental problems?: No Does the patient have eating habits or behaviors that may be indicators of an eating disorder including binging or inducing vomiting?: No Has the patient recently lost weight without trying?: 0 Has the patient been eating poorly because of a decreased appetite?: 1 Malnutrition Screening Tool Score: 1    Physical Exam HENT:     Head: Normocephalic.     Nose: Nose normal.  Eyes:     Conjunctiva/sclera: Conjunctivae normal.  Cardiovascular:     Rate and Rhythm: Normal rate.  Pulmonary:      Effort: Pulmonary effort is normal.  Musculoskeletal:        General: Normal range of motion.     Cervical back: Normal range of motion.  Neurological:     Mental Status: She is alert and oriented to person, place, and time.    Review of Systems  Constitutional: Negative.   HENT: Negative.    Eyes: Negative.   Respiratory: Negative.    Cardiovascular: Negative.   Gastrointestinal:  Positive for diarrhea and nausea.       One loose stool this morning  Neurological: Negative.   Endo/Heme/Allergies: Negative.   Psychiatric/Behavioral:  Positive for substance abuse. The patient is nervous/anxious.     There were no vitals taken for this visit. There is no height or weight on file to calculate BMI.  Past Psychiatric History: History of alcohol dependence and panic attacks. No outpatient psychiatry or therapy. No past inpatient psychiatric hospitalizations. No past suicide attempts. No past substance abuse history.   Is the patient at risk to self? No  Has the patient been a risk to self in the past 6 months? No .    Has the patient been a risk to self within the distant past? No   Is the patient a risk to others? No   Has the patient been a risk to others in the past 6 months? No   Has the patient been  a risk to others within the distant past? No   Past Medical History: None.   Family History: She reports a family psychiatric history of brother died this year from alcoholism and grandfather hx of alcoholism.   Social History: She resides with her husband, and 2 daughters ages 27 and 86 years old. She denies access to firearms in the home. She is employed full-time remotely as a Production designer, theatre/television/film. She denies using illicit drugs. UDS positive for benzo's recently prescribed Librium on 9/24.  Last Labs:  Admission on 03/30/2023, Discharged on 03/30/2023  Component Date Value Ref Range Status   WBC 03/30/2023 11.1 (H)  4.0 - 10.5 K/uL Final   RBC 03/30/2023 5.12 (H)  3.87 - 5.11  MIL/uL Final   Hemoglobin 03/30/2023 15.4 (H)  12.0 - 15.0 g/dL Final   HCT 40/98/1191 45.0  36.0 - 46.0 % Final   MCV 03/30/2023 87.9  80.0 - 100.0 fL Final   MCH 03/30/2023 30.1  26.0 - 34.0 pg Final   MCHC 03/30/2023 34.2  30.0 - 36.0 g/dL Final   RDW 47/82/9562 11.8  11.5 - 15.5 % Final   Platelets 03/30/2023 332  150 - 400 K/uL Final   nRBC 03/30/2023 0.0  0.0 - 0.2 % Final   Neutrophils Relative % 03/30/2023 84  % Final   Neutro Abs 03/30/2023 9.4 (H)  1.7 - 7.7 K/uL Final   Lymphocytes Relative 03/30/2023 10  % Final   Lymphs Abs 03/30/2023 1.1  0.7 - 4.0 K/uL Final   Monocytes Relative 03/30/2023 4  % Final   Monocytes Absolute 03/30/2023 0.5  0.1 - 1.0 K/uL Final   Eosinophils Relative 03/30/2023 0  % Final   Eosinophils Absolute 03/30/2023 0.0  0.0 - 0.5 K/uL Final   Basophils Relative 03/30/2023 1  % Final   Basophils Absolute 03/30/2023 0.1  0.0 - 0.1 K/uL Final   Immature Granulocytes 03/30/2023 1  % Final   Abs Immature Granulocytes 03/30/2023 0.05  0.00 - 0.07 K/uL Final   Performed at [facility], [address] [place], [place] [address]   Sodium 03/30/2023 139  135 - 145 mmol/L Final   Potassium 03/30/2023 3.8  3.5 - 5.1 mmol/L Final   Chloride 03/30/2023 104  98 - 111 mmol/L Final   CO2 03/30/2023 22  22 - 32 mmol/L Final   Glucose, Bld 03/30/2023 94  70 - 99 mg/dL Final   Glucose reference range applies only to samples taken after fasting for at least 8 hours.   BUN 03/30/2023 <5 (L)  6 - 20 mg/dL Final   Creatinine, Ser 03/30/2023 0.71  0.44 - 1.00 mg/dL Final   Calcium 57/84/6962 9.3  8.9 - 10.3 mg/dL Final   Total Protein 95/28/4132 7.9  6.5 - 8.1 g/dL Final   Albumin 44/07/270 4.4  3.5 - 5.0 g/dL Final   AST 53/66/4403 24  15 - 41 U/L Final   ALT 03/30/2023 18  0 - 44 U/L Final   Alkaline Phosphatase 03/30/2023 74  38 - 126 U/L Final   Total Bilirubin 03/30/2023 0.6  0.3 - 1.2 mg/dL Final   GFR, Estimated 03/30/2023 >60  >60 mL/min Final    Comment: (NOTE) Calculated using the CKD-EPI Creatinine Equation (2021)    Anion gap 03/30/2023 13  5 - 15 Final   Performed at [facility], [address], [place], [place] [address]   Hgb A1c MFr Bld 03/30/2023 5.1  4.8 - 5.6 %  Final   Comment: (NOTE) Pre diabetes:          5.7%-6.4%  Diabetes:              >6.4%  Glycemic control for   <7.0% adults with diabetes    Mean Plasma Glucose 03/30/2023 99.67  mg/dL Final   Performed at [facility], [address] [place], [place] [address]   Alcohol, Ethyl (B) 03/30/2023 <10  <10 mg/dL Final   Comment: (NOTE) Lowest detectable limit for serum alcohol is 10 mg/dL.  For medical purposes only. Performed at [facility], [address], [place], [place] [address]    Cholesterol 03/30/2023 217 (H)  0 - 200 mg/dL Final   Triglycerides 66/44/0347 41  <150 mg/dL Final   HDL 42/59/5638 89  >40 mg/dL Final   Total CHOL/HDL Ratio 03/30/2023 2.4  RATIO Final   VLDL 03/30/2023 8  0 - 40 mg/dL Final   LDL Cholesterol 03/30/2023 120 (H)  0 - 99 mg/dL Final   Comment:        Total Cholesterol/HDL:CHD Risk Coronary Heart Disease Risk Table                     Men   Women  1/2 Average Risk   3.4   3.3  Average Risk       5.0   4.4  2 X Average Risk   9.6   7.1  3 X Average Risk  23.4   11.0        Use the calculated Patient Ratio above and the CHD Risk Table to determine the patient's CHD Risk.        ATP III CLASSIFICATION (LDL):  <100     mg/dL   Optimal  756-433  mg/dL   Near or Above                    Optimal  130-159  mg/dL   Borderline  295-188  mg/dL   High  >416     mg/dL   Very High Performed at [facility], [address] [place], [place] [address]    TSH 03/30/2023 0.823  0.350 - 4.500 uIU/mL Final   Comment: Performed by a 3rd Generation assay with a functional sensitivity of <=0.01 uIU/mL. Performed at [facility], [address], [place], [place] [address]    Preg Test, Ur  03/30/2023 Negative  Negative Final   POC Amphetamine UR 03/30/2023 None Detected  NONE DETECTED (Cut Off Level 1000 ng/mL) Final   POC Secobarbital (BAR) 03/30/2023 None Detected  NONE DETECTED (Cut Off Level 300 ng/mL) Final   POC Buprenorphine (BUP) 03/30/2023 None Detected  NONE DETECTED (Cut Off Level 10 ng/mL) Final   POC Oxazepam (BZO) 03/30/2023 Positive (A)  NONE DETECTED (Cut Off Level 300 ng/mL) Final   POC Cocaine UR 03/30/2023 None Detected  NONE DETECTED (Cut Off Level 300 ng/mL) Final   POC Methamphetamine UR 03/30/2023 None Detected  NONE DETECTED (Cut Off Level 1000 ng/mL) Final   POC Morphine 03/30/2023 None Detected  NONE DETECTED (Cut Off Level 300 ng/mL) Final   POC Methadone UR 03/30/2023 None Detected  NONE DETECTED (Cut Off Level 300 ng/mL) Final   POC Oxycodone UR 03/30/2023 None Detected  NONE DETECTED (Cut Off Level 100 ng/mL) Final   POC Marijuana UR 03/30/2023 None Detected  NONE DETECTED (Cut Off Level 50 ng/mL) Final  Admission on 03/12/2023,  Discharged on 03/12/2023  Component Date Value Ref Range Status   WBC 03/12/2023 9.1  4.0 - 10.5 K/uL Final   RBC 03/12/2023 4.49  3.87 - 5.11 MIL/uL Final   Hemoglobin 03/12/2023 13.8  12.0 - 15.0 g/dL Final   HCT 16/04/9603 39.8  36.0 - 46.0 % Final   MCV 03/12/2023 88.6  80.0 - 100.0 fL Final   MCH 03/12/2023 30.7  26.0 - 34.0 pg Final   MCHC 03/12/2023 34.7  30.0 - 36.0 g/dL Final   RDW 54/03/8118 11.9  11.5 - 15.5 % Final   Platelets 03/12/2023 235  150 - 400 K/uL Final   nRBC 03/12/2023 0.0  0.0 - 0.2 % Final   Neutrophils Relative % 03/12/2023 73  % Final   Neutro Abs 03/12/2023 6.7  1.7 - 7.7 K/uL Final   Lymphocytes Relative 03/12/2023 17  % Final   Lymphs Abs 03/12/2023 1.6  0.7 - 4.0 K/uL Final   Monocytes Relative 03/12/2023 8  % Final   Monocytes Absolute 03/12/2023 0.7  0.1 - 1.0 K/uL Final   Eosinophils Relative 03/12/2023 1  % Final   Eosinophils Absolute 03/12/2023 0.1  0.0 - 0.5 K/uL Final    Basophils Relative 03/12/2023 1  % Final   Basophils Absolute 03/12/2023 0.1  0.0 - 0.1 K/uL Final   Immature Granulocytes 03/12/2023 0  % Final   Abs Immature Granulocytes 03/12/2023 0.03  0.00 - 0.07 K/uL Final   Performed at [facility], [address], [place], [place] [address]   Sodium 03/12/2023 137  135 - 145 mmol/L Final   Potassium 03/12/2023 3.3 (L)  3.5 - 5.1 mmol/L Final   Chloride 03/12/2023 109  98 - 111 mmol/L Final   CO2 03/12/2023 19 (L)  22 - 32 mmol/L Final   Glucose, Bld 03/12/2023 110 (H)  70 - 99 mg/dL Final   Glucose reference range applies only to samples taken after fasting for at least 8 hours.   BUN 03/12/2023 11  6 - 20 mg/dL Final   Creatinine, Ser 03/12/2023 0.61  0.44 - 1.00 mg/dL Final   Calcium 95/62/1308 8.9  8.9 - 10.3 mg/dL Final   Total Protein 65/78/4696 7.2  6.5 - 8.1 g/dL Final   Albumin 29/52/8413 4.0  3.5 - 5.0 g/dL Final   AST 24/40/1027 47 (H)  15 - 41 U/L Final   ALT 03/12/2023 21  0 - 44 U/L Final   Alkaline Phosphatase 03/12/2023 66  38 - 126 U/L Final   Total Bilirubin 03/12/2023 1.3 (H)  0.3 - 1.2 mg/dL Final   GFR, Estimated 03/12/2023 >60  >60 mL/min Final   Comment: (NOTE) Calculated using the CKD-EPI Creatinine Equation (2021)    Anion gap 03/12/2023 9  5 - 15 Final   Performed at [facility], [address], [place], [place] [address]   Lipase 03/12/2023 32  11 - 51 U/L Final   Performed at [facility], [address] [place], [place] [address]   TSH 03/12/2023 2.638  0.350 - 4.500 uIU/mL Final   Comment: Performed by a 3rd Generation assay with a functional sensitivity of <=0.01 uIU/mL. Performed at [facility], [address], [place], [place] [address]     Allergies: Patient has no known allergies.  Medications:  Facility Ordered Medications  Medication   [COMPLETED] thiamine (VITAMIN B1) injection 100 mg   acetaminophen (TYLENOL) tablet  650 mg   alum & mag hydroxide-simeth (  MAALOX/MYLANTA) 200-200-20 MG/5ML suspension 30 mL   magnesium hydroxide (MILK OF MAGNESIA) suspension 30 mL   hydrOXYzine (ATARAX) tablet 25 mg   traZODone (DESYREL) tablet 50 mg   [START ON 03/31/2023] multivitamin with minerals tablet 1 tablet   chlordiazePOXIDE (LIBRIUM) capsule 25 mg   loperamide (IMODIUM) capsule 2-4 mg   ondansetron (ZOFRAN-ODT) disintegrating tablet 4 mg   chlordiazePOXIDE (LIBRIUM) capsule 25 mg   Followed by   Melene Muller ON 03/31/2023] chlordiazePOXIDE (LIBRIUM) capsule 25 mg   Followed by   Melene Muller ON 04/01/2023] chlordiazePOXIDE (LIBRIUM) capsule 25 mg   Followed by   Melene Muller ON 04/02/2023] chlordiazePOXIDE (LIBRIUM) capsule 25 mg   [START ON 03/31/2023] thiamine (VITAMIN B1) tablet 100 mg   [START ON 03/31/2023] escitalopram (LEXAPRO) tablet 10 mg   PTA Medications  Medication Sig   Multiple Vitamins-Minerals (MULTI COMPLETE PO) Take 1 tablet by mouth daily.   Calcium-Magnesium-Vitamin D (CALCIUM 500 PO) Take 1 tablet by mouth daily.   tretinoin (ALTRALIN) 0.05 % gel Apply 1 Application topically at bedtime.   escitalopram (LEXAPRO) 10 MG tablet Take 1.5 tablets (15 mg) by mouth daily.   ALPRAZolam (XANAX) 0.25 MG tablet Take 1 tablet (0.25 mg total) by mouth 3 (three) times daily as needed for anxiety.    Long Term Goals: Improvement in symptoms so as ready for discharge  Short Term Goals: Patient will verbalize feelings in meetings with treatment team members., Patient will attend at least of 50% of the groups daily., Pt will complete the PHQ9 on admission, day 3 and discharge., and Patient will take medications as prescribed daily.  Medical Decision Making  Patient admitted to the [facility] Facility Based Crisis unit for alcohol detox. Patient is voluntary.   -will initiate Librium 25 mg taper for alcohol withdrawal protocol and add CIWA to monitor alcohol withdrawal symptoms/signs.   -restart home  medication Lexapro 10 mg po daily for anxiety/panic attacks  Recommendations  Based on my evaluation the patient does not appear to have an emergency medical condition.  Layla Barter, NP 03/30/23  4:54 PM

## 2023-03-30 NOTE — ED Notes (Addendum)
Pt transferred from observation unit to [facility] requesting detox from ETOH and increased anxiety (panic attacks). Cooperative throughout interview process. Mild tremors observed during assessment. Pt voiced, "I'm starting to feel a little anxious. I've never done anything like this before. I just wanted some help getting off this ETOH without having panic attacks. It scares me to think about going through withdrawals". Support and encouragement provided. Reviewed printed handout on effective coping skills provided to pt in admissions welcme packet as distraction techniques utilized to help with sx. Pt engaged. Skin assessment completed. Oriented to unit. Meal and drink offered. Pt denies SI/HI/AVH. Pt verbally contract for safety. Will monitor for safety.

## 2023-03-30 NOTE — Group Note (Signed)
Group Topic: Communication  Group Date: 03/30/2023 Start Time: 1344 End Time: 1415 Facilitators: Prentice Docker, RN  Department: [facility]  Number of Participants: 1  Group Focus:  Treatment Modality:  Individual Therapy Interventions utilized were orientation Purpose: express feelings  Name: Victoria Nash Date of Birth: 1981/03/13  MR: [id number]    Level of Participation: active Quality of Participation: attentive and cooperative Interactions with others: gave feedback Mood/Affect: anxious Triggers (if applicable): ETOH withdrawals Cognition: coherent/clear Progress: Gaining insight Response: "I'm scared to think about going through the withdrawal part of it all. It makes my anxiety go up" Plan: patient will be encouraged to continue tx plan  Patients Problems:  Patient Active Problem List   Diagnosis Date Noted   Alcohol dependence (HCC) 03/30/2023   GAD (generalized anxiety disorder) 04/19/2011

## 2023-03-30 NOTE — ED Notes (Signed)
Patient is sleeping. Respirations equal and unlabored, skin warm and dry. No change in assessment or acuity. Routine safety checks conducted according to facility protocol. Will continue to monitor for safety.   
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
## 2023-03-30 NOTE — ED Notes (Signed)
Pt sleeping in no acute distress. RR even and unlabored. Environment secured. Will continue to monitor for safety. 

## 2023-03-31 DIAGNOSIS — F10230 Alcohol dependence with withdrawal, uncomplicated: Secondary | ICD-10-CM | POA: Diagnosis not present

## 2023-03-31 NOTE — Group Note (Signed)
Group Topic: Recovery Basics  Group Date: 03/31/2023 Start Time: 1000 End Time: 1050 Facilitators: Priscille Kluver, NT  Department: [facility]  Number of Participants: 5  Group Focus: community group Treatment Modality:  Solution-Focused Therapy Interventions utilized were support Purpose: enhance coping skills and relapse prevention strategies  Name: Victoria Nash Date of Birth: 10-05-1980  MR: [id number]    Level of Participation: active Quality of Participation: attentive Interactions with others: gave feedback Mood/Affect: positive Cognition: insightful Progress: Moderate  Patients Problems:  Patient Active Problem List   Diagnosis Date Noted   Alcohol dependence (HCC) 03/30/2023   GAD (generalized anxiety disorder) 04/19/2011

## 2023-03-31 NOTE — ED Notes (Signed)
Patient is in the bedroom sleeping.NAD.  Respirations are even and unlabored.  Will continue to monitor for safety.
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
## 2023-03-31 NOTE — ED Notes (Signed)
Pt sleeping in no acute distress. RR even and unlabored. Environment secured. Will continue to monitor for safety. 

## 2023-03-31 NOTE — ED Notes (Signed)
Pt is in the bedroom sleeping. NAD,Respirations are even and unlabored. Will continue to monitor for safety

## 2023-03-31 NOTE — ED Provider Notes (Signed)
Behavioral Health Progress Note  Date and Time: 03/31/2023 11:36 AM Name: Victoria Nash MRN:  [id number]  Subjective:  Victoria Nash is a 42 y.o. female patient with a past history significant for alcohol dependence and panic attacks who presents to the [facility] for alcohol use detox and resources.  Patient seen this morning. She notes feeling fair, reporting good sleep and appetite. She reports her goal after detoxing is to go to an OP psychiatrist and therapist. She has spoken with a therapist and plans to make an appointment. She reports anxiety has been a big problem in her life in which she self medications through alcohol. She notes drinking 1-2 bottles of wine daily. She notes drinking at night when she is winding down. She states that she lives a fairly isolate life working remote which does not help use alcohol use.   She notes getting weekly panic attacks, describing "feeling hot, heart racing, pacing back and forth, light headed". She states most of the times there are stressful events that trigger the panic attack but sometimes, they are more random. She also worried of getting future panic attacks. She previously would take Xanax, reporting taking once every 6 months, in which she states it helped. She was recently prescribed Lexapro and has been taking the medication for a few days. She denies SI, HI, and AVH.   Diagnosis:  Final diagnoses:  Alcohol dependence with uncomplicated withdrawal (HCC)  Panic attacks  Alcohol-induced mood disorder (HCC)    Total Time spent with patient: 45 minutes  Past Psychiatric History: panic disorder, AUD Past Medical History: Denies Family History: AUD with brother Social History: Lives in [place] with husband and two daughters, ages 73 and 42. 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 Support system includes her husband, mother, and friends at church. Denies legal issues and access to firearms  Additional Social History:                          Sleep: Fair  Appetite:  Fair  Current Medications:  Current Facility-Administered Medications  Medication Dose Route Frequency Provider Last Rate Last Admin   acetaminophen (TYLENOL) tablet 650 mg  650 mg Oral Q6H PRN White, Patrice L, NP       alum & mag hydroxide-simeth (MAALOX/MYLANTA) 200-200-20 MG/5ML suspension 30 mL  30 mL Oral Q4H PRN White, Patrice L, NP       chlordiazePOXIDE (LIBRIUM) capsule 25 mg  25 mg Oral Q6H PRN White, Patrice L, NP       chlordiazePOXIDE (LIBRIUM) capsule 25 mg  25 mg Oral TID White, Patrice L, NP   25 mg at 03/31/23 1000   Followed by   Melene Muller ON 04/01/2023] chlordiazePOXIDE (LIBRIUM) capsule 25 mg  25 mg Oral BH-qamhs White, Patrice L, NP       Followed by   Melene Muller ON 04/02/2023] chlordiazePOXIDE (LIBRIUM) capsule 25 mg  25 mg Oral Daily White, Patrice L, NP       escitalopram (LEXAPRO) tablet 10 mg  10 mg Oral Daily White, Patrice L, NP   10 mg at 03/31/23 1000   hydrOXYzine (ATARAX) tablet 25 mg  25 mg Oral TID PRN White, Patrice L, NP   25 mg at 03/31/23 1000   loperamide (IMODIUM) capsule 2-4 mg  2-4 mg Oral PRN White, Patrice L, NP       magnesium hydroxide (MILK OF MAGNESIA) suspension 30 mL  30 mL Oral Daily PRN White,  Patrice L, NP       multivitamin with minerals tablet 1 tablet  1 tablet Oral Daily White, Patrice L, NP   1 tablet at 03/31/23 1000   ondansetron (ZOFRAN-ODT) disintegrating tablet 4 mg  4 mg Oral Q6H PRN White, Patrice L, NP       thiamine (VITAMIN B1) tablet 100 mg  100 mg Oral Daily White, Patrice L, NP   100 mg at 03/31/23 0959   traZODone (DESYREL) tablet 50 mg  50 mg Oral QHS PRN White, Patrice L, NP       Current Outpatient Medications  Medication Sig Dispense Refill   ALPRAZolam (XANAX) 0.25 MG tablet Take 1 tablet (0.25 mg total) by mouth 3 (three) times daily as needed for anxiety. 15 tablet 0   Calcium-Magnesium-Vitamin D (CALCIUM 500 PO) Take 1 tablet by mouth daily.     escitalopram (LEXAPRO) 10 MG tablet  Take 1.5 tablets (15 mg) by mouth daily. 135 tablet 0   Multiple Vitamins-Minerals (MULTI COMPLETE PO) Take 1 tablet by mouth daily.     tretinoin (ALTRALIN) 0.05 % gel Apply 1 Application topically at bedtime.      Labs  Lab Results:  Admission on 03/30/2023, Discharged on 03/30/2023  Component Date Value Ref Range Status   WBC 03/30/2023 11.1 (H)  4.0 - 10.5 K/uL Final   RBC 03/30/2023 5.12 (H)  3.87 - 5.11 MIL/uL Final   Hemoglobin 03/30/2023 15.4 (H)  12.0 - 15.0 g/dL Final   HCT 95/63/8756 45.0  36.0 - 46.0 % Final   MCV 03/30/2023 87.9  80.0 - 100.0 fL Final   MCH 03/30/2023 30.1  26.0 - 34.0 pg Final   MCHC 03/30/2023 34.2  30.0 - 36.0 g/dL Final   RDW 43/32/9518 11.8  11.5 - 15.5 % Final   Platelets 03/30/2023 332  150 - 400 K/uL Final   nRBC 03/30/2023 0.0  0.0 - 0.2 % Final   Neutrophils Relative % 03/30/2023 84  % Final   Neutro Abs 03/30/2023 9.4 (H)  1.7 - 7.7 K/uL Final   Lymphocytes Relative 03/30/2023 10  % Final   Lymphs Abs 03/30/2023 1.1  0.7 - 4.0 K/uL Final   Monocytes Relative 03/30/2023 4  % Final   Monocytes Absolute 03/30/2023 0.5  0.1 - 1.0 K/uL Final   Eosinophils Relative 03/30/2023 0  % Final   Eosinophils Absolute 03/30/2023 0.0  0.0 - 0.5 K/uL Final   Basophils Relative 03/30/2023 1  % Final   Basophils Absolute 03/30/2023 0.1  0.0 - 0.1 K/uL Final   Immature Granulocytes 03/30/2023 1  % Final   Abs Immature Granulocytes 03/30/2023 0.05  0.00 - 0.07 K/uL Final   Performed at [facility], [address] [place], [place] [address]   Sodium 03/30/2023 139  135 - 145 mmol/L Final   Potassium 03/30/2023 3.8  3.5 - 5.1 mmol/L Final   Chloride 03/30/2023 104  98 - 111 mmol/L Final   CO2 03/30/2023 22  22 - 32 mmol/L Final   Glucose, Bld 03/30/2023 94  70 - 99 mg/dL Final   Glucose reference range applies only to samples taken after fasting for at least 8 hours.   BUN 03/30/2023 <5 (L)  6 - 20 mg/dL Final   Creatinine, Ser 03/30/2023 0.71  0.44 -  1.00 mg/dL Final   Calcium 01/05/1600 9.3  8.9 - 10.3 mg/dL Final   Total Protein 09/32/3557 7.9  6.5 - 8.1 g/dL Final   Albumin  03/30/2023 4.4  3.5 - 5.0 g/dL Final   AST 41/32/4401 24  15 - 41 U/L Final   ALT 03/30/2023 18  0 - 44 U/L Final   Alkaline Phosphatase 03/30/2023 74  38 - 126 U/L Final   Total Bilirubin 03/30/2023 0.6  0.3 - 1.2 mg/dL Final   GFR, Estimated 03/30/2023 >60  >60 mL/min Final   Comment: (NOTE) Calculated using the CKD-EPI Creatinine Equation (2021)    Anion gap 03/30/2023 13  5 - 15 Final   Performed at [facility], [address], [place], [place] [address]   Hgb A1c MFr Bld 03/30/2023 5.1  4.8 - 5.6 % Final   Comment: (NOTE) Pre diabetes:          5.7%-6.4%  Diabetes:              >6.4%  Glycemic control for   <7.0% adults with diabetes    Mean Plasma Glucose 03/30/2023 99.67  mg/dL Final   Performed at [facility], [address] [place], [place] [address]   Alcohol, Ethyl (B) 03/30/2023 <10  <10 mg/dL Final   Comment: (NOTE) Lowest detectable limit for serum alcohol is 10 mg/dL.  For medical purposes only. Performed at [facility], [address] [place], [place] [address]    Cholesterol 03/30/2023 217 (H)  0 - 200 mg/dL Final   Triglycerides 25/95/6387 41  <150 mg/dL Final   HDL 56/43/3295 89  >40 mg/dL Final   Total CHOL/HDL Ratio 03/30/2023 2.4  RATIO Final   VLDL 03/30/2023 8  0 - 40 mg/dL Final   LDL Cholesterol 03/30/2023 120 (H)  0 - 99 mg/dL Final   Comment:        Total Cholesterol/HDL:CHD Risk Coronary Heart Disease Risk Table                     Men   Women  1/2 Average Risk   3.4   3.3  Average Risk       5.0   4.4  2 X Average Risk   9.6   7.1  3 X Average Risk  23.4   11.0        Use the calculated Patient Ratio above and the CHD Risk Table to determine the patient's CHD Risk.        ATP III CLASSIFICATION (LDL):  <100     mg/dL   Optimal  188-416  mg/dL   Near or Above                     Optimal  130-159  mg/dL   Borderline  606-301  mg/dL   High  >601     mg/dL   Very High Performed at [facility], [address] [place], [place] [address]    TSH 03/30/2023 0.823  0.350 - 4.500 uIU/mL Final   Comment: Performed by a 3rd Generation assay with a functional sensitivity of <=0.01 uIU/mL. Performed at [facility], [address] [place], [place] [address]    Preg Test, Ur 03/30/2023 Negative  Negative Final   POC Amphetamine UR 03/30/2023 None Detected  NONE DETECTED (Cut Off Level 1000 ng/mL) Final   POC Secobarbital (BAR) 03/30/2023 None Detected  NONE DETECTED (Cut Off Level 300 ng/mL) Final   POC Buprenorphine (BUP) 03/30/2023 None Detected  NONE DETECTED (Cut Off Level 10 ng/mL) Final   POC Oxazepam (BZO) 03/30/2023 Positive (  A)  NONE DETECTED (Cut Off Level 300 ng/mL) Final   POC Cocaine UR 03/30/2023 None Detected  NONE DETECTED (Cut Off Level 300 ng/mL) Final   POC Methamphetamine UR 03/30/2023 None Detected  NONE DETECTED (Cut Off Level 1000 ng/mL) Final   POC Morphine 03/30/2023 None Detected  NONE DETECTED (Cut Off Level 300 ng/mL) Final   POC Methadone UR 03/30/2023 None Detected  NONE DETECTED (Cut Off Level 300 ng/mL) Final   POC Oxycodone UR 03/30/2023 None Detected  NONE DETECTED (Cut Off Level 100 ng/mL) Final   POC Marijuana UR 03/30/2023 None Detected  NONE DETECTED (Cut Off Level 50 ng/mL) Final  Admission on 03/12/2023, Discharged on 03/12/2023  Component Date Value Ref Range Status   WBC 03/12/2023 9.1  4.0 - 10.5 K/uL Final   RBC 03/12/2023 4.49  3.87 - 5.11 MIL/uL Final   Hemoglobin 03/12/2023 13.8  12.0 - 15.0 g/dL Final   HCT 81/82/9937 39.8  36.0 - 46.0 % Final   MCV 03/12/2023 88.6  80.0 - 100.0 fL Final   MCH 03/12/2023 30.7  26.0 - 34.0 pg Final   MCHC 03/12/2023 34.7  30.0 - 36.0 g/dL Final   RDW 16/96/7893 11.9  11.5 - 15.5 % Final   Platelets 03/12/2023 235  150 - 400 K/uL Final   nRBC 03/12/2023 0.0  0.0 - 0.2 % Final    Neutrophils Relative % 03/12/2023 73  % Final   Neutro Abs 03/12/2023 6.7  1.7 - 7.7 K/uL Final   Lymphocytes Relative 03/12/2023 17  % Final   Lymphs Abs 03/12/2023 1.6  0.7 - 4.0 K/uL Final   Monocytes Relative 03/12/2023 8  % Final   Monocytes Absolute 03/12/2023 0.7  0.1 - 1.0 K/uL Final   Eosinophils Relative 03/12/2023 1  % Final   Eosinophils Absolute 03/12/2023 0.1  0.0 - 0.5 K/uL Final   Basophils Relative 03/12/2023 1  % Final   Basophils Absolute 03/12/2023 0.1  0.0 - 0.1 K/uL Final   Immature Granulocytes 03/12/2023 0  % Final   Abs Immature Granulocytes 03/12/2023 0.03  0.00 - 0.07 K/uL Final   Performed at [facility], [address], [place], [place] [address]   Sodium 03/12/2023 137  135 - 145 mmol/L Final   Potassium 03/12/2023 3.3 (L)  3.5 - 5.1 mmol/L Final   Chloride 03/12/2023 109  98 - 111 mmol/L Final   CO2 03/12/2023 19 (L)  22 - 32 mmol/L Final   Glucose, Bld 03/12/2023 110 (H)  70 - 99 mg/dL Final   Glucose reference range applies only to samples taken after fasting for at least 8 hours.   BUN 03/12/2023 11  6 - 20 mg/dL Final   Creatinine, Ser 03/12/2023 0.61  0.44 - 1.00 mg/dL Final   Calcium 51/08/5850 8.9  8.9 - 10.3 mg/dL Final   Total Protein 77/82/4235 7.2  6.5 - 8.1 g/dL Final   Albumin 36/14/4315 4.0  3.5 - 5.0 g/dL Final   AST 40/02/6760 47 (H)  15 - 41 U/L Final   ALT 03/12/2023 21  0 - 44 U/L Final   Alkaline Phosphatase 03/12/2023 66  38 - 126 U/L Final   Total Bilirubin 03/12/2023 1.3 (H)  0.3 - 1.2 mg/dL Final   GFR, Estimated 03/12/2023 >60  >60 mL/min Final   Comment: (NOTE) Calculated using the CKD-EPI Creatinine Equation (2021)    Anion gap 03/12/2023 9  5 - 15 Final   Performed at [facility], [address], [place], [place] [address]   Lipase 03/12/2023 32  11 - 51 U/L Final   Performed at [facility], [address], [place], [place] [address]   TSH 03/12/2023 2.638  0.350  - 4.500 uIU/mL Final   Comment: Performed by a 3rd Generation assay with a functional sensitivity of <=0.01 uIU/mL. Performed at [facility], [address] [place], [place] [address]     Blood Alcohol level:  Lab Results  Component Value Date   ETH <10 03/30/2023    Metabolic Disorder Labs: Lab Results  Component Value Date   HGBA1C 5.1 03/30/2023   MPG 99.67 03/30/2023   Lab Results  Component Value Date   PROLACTIN 12.6 05/17/2020   Lab Results  Component Value Date   CHOL 217 (H) 03/30/2023   TRIG 41 03/30/2023   HDL 89 03/30/2023   CHOLHDL 2.4 03/30/2023   VLDL 8 03/30/2023   LDLCALC 120 (H) 03/30/2023   LDLCALC 91 08/13/2021    Therapeutic Lab Levels: No results found for: "LITHIUM" No results found for: "VALPROATE" No results found for: "CBMZ"  Physical Findings   AUDIT    Flowsheet Row ED from 03/30/2023 in [facility]  Alcohol Use Disorder Identification Test Final Score (AUDIT) 19      PHQ2-9    Flowsheet Row ED from 03/30/2023 in [facility]  PHQ-2 Total Score 3  PHQ-9 Total Score 18      Flowsheet Row ED from 03/30/2023 in [facility] Most recent reading at 03/30/2023  2:25 PM ED from 03/30/2023 in [facility] Most recent reading at 03/30/2023  7:23 AM ED from 03/12/2023 in [facility] Emergency Department at [facility] Most recent reading at 03/12/2023  6:36 AM  C-SSRS RISK CATEGORY No Risk No Risk No Risk        Musculoskeletal  Strength & Muscle Tone: within normal limits Gait & Station: normal Patient leans: N/A  Psychiatric Specialty Exam  Presentation  General Appearance:  Appropriate for Environment; Casual  Eye Contact: Fair  Speech: Clear and Coherent  Speech Volume: Normal  Handedness: Right   Mood and Affect  Mood: Euthymic  Affect: Congruent; Appropriate   Thought  Process  Thought Processes: Coherent  Descriptions of Associations:Intact  Orientation:Full (Time, Place and Person)  Thought Content:Logical; WDL  Diagnosis of Schizophrenia or Schizoaffective disorder in past: No    Hallucinations:Hallucinations: None  Ideas of Reference:None  Suicidal Thoughts:Suicidal Thoughts: No  Homicidal Thoughts:Homicidal Thoughts: No   Sensorium  Memory: Remote Good  Judgment: Fair  Insight: Fair   Art therapist  Concentration: Good  Attention Span: Good  Recall: Good  Fund of Knowledge: Good  Language: Good   Psychomotor Activity  Psychomotor Activity: Psychomotor Activity: Normal   Assets  Assets: Communication Skills; Desire for Improvement; Financial Resources/Insurance; Housing; Leisure Time; Intimacy; Physical Health; Resilience; Social Support   Sleep  Sleep: Sleep: Fair Number of Hours of Sleep: 6   Nutritional Assessment (For OBS and FBC admissions only) Has the patient had a weight loss or gain of 10 pounds or more in the last 3 months?: No Has the patient had a decrease in food intake/or appetite?: Yes Does the patient have dental problems?: No Does the patient have eating habits or behaviors that may be indicators of an eating disorder including binging or inducing vomiting?: No Has the patient recently lost weight without trying?:  0 Has the patient been eating poorly because of a decreased appetite?: 1 Malnutrition Screening Tool Score: 1    Physical Exam  Physical Exam Vitals reviewed.  Constitutional:      Appearance: Normal appearance.  HENT:     Head: Normocephalic and atraumatic.  Cardiovascular:     Rate and Rhythm: Normal rate.  Pulmonary:     Effort: Pulmonary effort is normal.  Musculoskeletal:        General: Normal range of motion.  Neurological:     Mental Status: She is alert.    Review of Systems  Constitutional:  Negative for chills and fever.  Respiratory:   Negative for cough and hemoptysis.   Cardiovascular:  Negative for chest pain and palpitations.  Gastrointestinal:  Negative for nausea and vomiting.  Musculoskeletal:  Negative for myalgias and neck pain.   Blood pressure 118/83, pulse (!) 107, temperature 98.5 F (36.9 C), temperature source Oral, resp. rate 18, SpO2 98%. There is no height or weight on file to calculate BMI.  Treatment Plan Summary:  Alcohol Use Disorder Alcohol Withdrawal -Librium taper -CIWA with Librium as needed for CIWA greater than 10  -Last CIWA score is 1  -Thiamine 100 mg IM first day and PO after that -Multivitamin with minerals daily -Tylenol 650 mg every 6 hours as needed for pain -Zofran 4 mg every 6 hours as needed for nausea or vomiting -Imodium 2 to 4 mg as needed for diarrhea or loose stools  -Maalox/Mylanta 30 mL every 4 hours as needed for indigestion -Milk of Mag 30 mL as needed for constipation  Substance induced mood disorder Panic disorder -Restart home Lexapro 10 mg PRN medications: -Hydroxyzine 25 mg q6h PRN for anxiety -Trazodone 50 mg qhs PRN for sleep   Dispo: home with OP resources  Lance Muss, MD 03/31/2023 11:36 AM

## 2023-03-31 NOTE — ED Notes (Signed)
Pt sitting in dayroom watching tv. No acute distress noted. No concerns voiced. Informed pt to notify staff with any needs or assistance. Pt verbalized understanding or agreement. Will continue to monitor for safety.

## 2023-03-31 NOTE — ED Notes (Signed)
Patient A&Ox4. Denies intent to harm self/others when asked. Denies A/VH. Patient denies any physical complaints when asked. No acute distress noted. Pt states, "I feel pretty good this morning". Support and praise provided. Routine safety checks conducted according to facility protocol. Encouraged patient to notify staff if thoughts of harm toward self or others arise. Patient verbalize understanding and agreement. Will continue to monitor for safety.

## 2023-03-31 NOTE — Group Note (Signed)
Group Topic: Communication  Group Date: 03/31/2023 Start Time: 0900 End Time: 0950 Facilitators: Prentice Docker, RN  Department: [facility]  Number of Participants: 6  Group Focus: other medication education Treatment Modality:  Individual Therapy Interventions utilized were patient education Purpose: increase insight  Name: Victoria Nash Date of Birth: 05-25-1981  MR: [id number]    Level of Participation: active Quality of Participation: cooperative Interactions with others: individual communication Mood/Affect: appropriate Triggers (if applicable): n/a Cognition: coherent/clear Progress: Gaining insight Response: verbalized understanding of medication administered Plan: patient will be encouraged to continue medication and tx compliance  Patients Problems:  Patient Active Problem List   Diagnosis Date Noted   Alcohol dependence (HCC) 03/30/2023   GAD (generalized anxiety disorder) 04/19/2011

## 2023-04-01 DIAGNOSIS — F10230 Alcohol dependence with withdrawal, uncomplicated: Secondary | ICD-10-CM | POA: Diagnosis not present

## 2023-04-01 NOTE — Group Note (Signed)
Group Topic: Change and Accountability  Group Date: 04/01/2023 Start Time: 1435 End Time: 1525 Facilitators: Vonzell Schlatter B  Department: [facility]  Number of Participants: 6  Group Focus: activities of daily living skills and daily focus Treatment Modality:  Psychoeducation Interventions utilized were problem solving and support Purpose: express feelings and regain self-worth  Name: Victoria Nash Date of Birth: 08-06-1980  MR: [id number]    Level of Participation: active Quality of Participation: attentive and cooperative Interactions with others: gave feedback Mood/Affect: positive Triggers (if applicable): n/a Cognition: coherent/clear Progress: Moderate Response: n/a Plan: follow-up needed  Patients Problems:  Patient Active Problem List   Diagnosis Date Noted   Alcohol dependence (HCC) 03/30/2023   GAD (generalized anxiety disorder) 04/19/2011

## 2023-04-01 NOTE — Group Note (Signed)
Group Topic: Wellness  Group Date: 04/01/2023 Start Time: 2000 End Time: 2030 Facilitators: Guss Bunde  Department: [facility]  Number of Participants: 4  Group Focus: check in Treatment Modality:  Psychoeducation Interventions utilized were support Purpose: reinforce self-care  Name: Victoria Nash Date of Birth: 12-06-1980  MR: [id number]    Level of Participation: active Quality of Participation: attentive Interactions with others: gave feedback Mood/Affect: appropriate Triggers (if applicable):  Cognition: goal directed Progress: Gaining insight Response: Pt wants to get anxiety under control by going to outpatient therapy Plan: patient will be encouraged to follow up with goals  Patients Problems:  Patient Active Problem List   Diagnosis Date Noted   Alcohol dependence (HCC) 03/30/2023   GAD (generalized anxiety disorder) 04/19/2011

## 2023-04-01 NOTE — Group Note (Signed)
Group Topic: Relapse and Recovery  Group Date: 04/01/2023 Start Time: 1415 End Time: 1435 Facilitators: Mayer Camel L, RN  Department: [facility]  Number of Participants: 5  Group Focus: nursing group Treatment Modality:  Psychoeducation Interventions utilized were patient education Purpose: express feelings, increase insight, and reinforce self-care  Name: ANNALAURA KUKLA Date of Birth: 01-Nov-1980  MR: [id number]    Level of Participation: active Quality of Participation: cooperative, engaged, motivated, and supportive Interactions with others: gave feedback Mood/Affect: appropriate and positive Triggers (if applicable): N/A Cognition: goal directed, insightful, and logical Progress: Gaining insight Response: Patient gained insight and was able to rationalize need to redirect activities to decrease desire to consume alcohol. Plan: follow-up needed  Patients Problems:  Patient Active Problem List   Diagnosis Date Noted   Alcohol dependence (HCC) 03/30/2023   GAD (generalized anxiety disorder) 04/19/2011

## 2023-04-01 NOTE — ED Notes (Signed)
Patient is A&O x 4. Pleasant, calm and cooperative. Flat affect. Denies withdrawal sx, SI, HI, AVH. No observed responses to internal stimuli. Patient expressed desire to possibly d/c today instead of tomorrow. Patient advised to communicate with the provider.

## 2023-04-01 NOTE — ED Notes (Signed)
Patient participating in group activities from 1435 to present in day room.Patient is calm and cooperative.

## 2023-04-01 NOTE — Discharge Planning (Signed)
LCSW spoke with patient to provide update regarding aftercare appts. Patient expressed appreciation for LCSW assistance with arranging an appointment for therapy and medication management. Patient reports she was also provided an update by her husband who is supportive in her process. No other needs were reported at this time.   LCSW will continue to follow and provide support to patient while on FBC unit.   Fernande Boyden, LCSW Clinical Social Worker [facility] Ph: [phone]

## 2023-04-01 NOTE — ED Provider Notes (Signed)
Behavioral Health Progress Note  Date and Time: 04/01/2023 10:56 AM Name: Victoria Nash MRN:  [id number]  Subjective:  Victoria Nash is a 42 y.o. female patient with a past history significant for alcohol dependence and panic attacks who presents to the [facility] for alcohol use detox and resources.  Patient seen this morning. She reports good sleep and a good appetite. She denies anxious feelings today. She also denies any withdrawal symptoms or medication side effects today (currently taking Librium, Lexapro, and hydroxyzine). She expresses a desire to go home as soon as possible, but upon discussion, is amenable to staying through tomorrow morning to complete a Librium taper. She is motivated to participate in outpatient treatment and has a psychiatry appointment scheduled for 9/30 and a therapy appointment scheduled for 10/1. She denies SI, HI, and AVH.    Diagnosis:  Final diagnoses:  Alcohol dependence with uncomplicated withdrawal (HCC)  Panic attacks  Alcohol-induced mood disorder (HCC)    Total Time spent with patient: 45 minutes  Past Psychiatric History: panic disorder, AUD Past Medical History: Denies Family History: AUD with brother Social History: Lives in [place] with husband and two daughters, ages 41 and 3. 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 Support system includes her husband, mother, and friends at church. Denies legal issues and access to firearms                   Sleep: Good  Appetite: Good  Current Medications:  Current Facility-Administered Medications  Medication Dose Route Frequency Provider Last Rate Last Admin   acetaminophen (TYLENOL) tablet 650 mg  650 mg Oral Q6H PRN White, Patrice L, NP       alum & mag hydroxide-simeth (MAALOX/MYLANTA) 200-200-20 MG/5ML suspension 30 mL  30 mL Oral Q4H PRN White, Patrice L, NP       chlordiazePOXIDE (LIBRIUM) capsule 25 mg  25 mg Oral Q6H PRN White, Patrice L, NP       chlordiazePOXIDE (LIBRIUM) capsule  25 mg  25 mg Oral BH-qamhs White, Patrice L, NP   25 mg at 04/01/23 1021   Followed by   Melene Muller ON 04/02/2023] chlordiazePOXIDE (LIBRIUM) capsule 25 mg  25 mg Oral Daily White, Patrice L, NP       escitalopram (LEXAPRO) tablet 10 mg  10 mg Oral Daily White, Patrice L, NP   10 mg at 04/01/23 1021   hydrOXYzine (ATARAX) tablet 25 mg  25 mg Oral TID PRN Liborio Nixon L, NP   25 mg at 03/31/23 2138   loperamide (IMODIUM) capsule 2-4 mg  2-4 mg Oral PRN White, Patrice L, NP       magnesium hydroxide (MILK OF MAGNESIA) suspension 30 mL  30 mL Oral Daily PRN White, Patrice L, NP       multivitamin with minerals tablet 1 tablet  1 tablet Oral Daily White, Patrice L, NP   1 tablet at 04/01/23 1022   ondansetron (ZOFRAN-ODT) disintegrating tablet 4 mg  4 mg Oral Q6H PRN White, Patrice L, NP       thiamine (VITAMIN B1) tablet 100 mg  100 mg Oral Daily White, Patrice L, NP   100 mg at 04/01/23 1021   traZODone (DESYREL) tablet 50 mg  50 mg Oral QHS PRN White, Patrice L, NP       Current Outpatient Medications  Medication Sig Dispense Refill   ALPRAZolam (XANAX) 0.25 MG tablet Take 1 tablet (0.25 mg total) by mouth 3 (three) times daily as  needed for anxiety. 15 tablet 0   Calcium-Magnesium-Vitamin D (CALCIUM 500 PO) Take 1 tablet by mouth daily.     escitalopram (LEXAPRO) 10 MG tablet Take 1.5 tablets (15 mg) by mouth daily. 135 tablet 0   Multiple Vitamins-Minerals (MULTI COMPLETE PO) Take 1 tablet by mouth daily.     tretinoin (ALTRALIN) 0.05 % gel Apply 1 Application topically at bedtime.      Labs  Lab Results:  Admission on 03/30/2023, Discharged on 03/30/2023  Component Date Value Ref Range Status   WBC 03/30/2023 11.1 (H)  4.0 - 10.5 K/uL Final   RBC 03/30/2023 5.12 (H)  3.87 - 5.11 MIL/uL Final   Hemoglobin 03/30/2023 15.4 (H)  12.0 - 15.0 g/dL Final   HCT 91/47/8295 45.0  36.0 - 46.0 % Final   MCV 03/30/2023 87.9  80.0 - 100.0 fL Final   MCH 03/30/2023 30.1  26.0 - 34.0 pg Final   MCHC  03/30/2023 34.2  30.0 - 36.0 g/dL Final   RDW 62/13/0865 11.8  11.5 - 15.5 % Final   Platelets 03/30/2023 332  150 - 400 K/uL Final   nRBC 03/30/2023 0.0  0.0 - 0.2 % Final   Neutrophils Relative % 03/30/2023 84  % Final   Neutro Abs 03/30/2023 9.4 (H)  1.7 - 7.7 K/uL Final   Lymphocytes Relative 03/30/2023 10  % Final   Lymphs Abs 03/30/2023 1.1  0.7 - 4.0 K/uL Final   Monocytes Relative 03/30/2023 4  % Final   Monocytes Absolute 03/30/2023 0.5  0.1 - 1.0 K/uL Final   Eosinophils Relative 03/30/2023 0  % Final   Eosinophils Absolute 03/30/2023 0.0  0.0 - 0.5 K/uL Final   Basophils Relative 03/30/2023 1  % Final   Basophils Absolute 03/30/2023 0.1  0.0 - 0.1 K/uL Final   Immature Granulocytes 03/30/2023 1  % Final   Abs Immature Granulocytes 03/30/2023 0.05  0.00 - 0.07 K/uL Final   Performed at [facility], [address] [place], [place] [address]   Sodium 03/30/2023 139  135 - 145 mmol/L Final   Potassium 03/30/2023 3.8  3.5 - 5.1 mmol/L Final   Chloride 03/30/2023 104  98 - 111 mmol/L Final   CO2 03/30/2023 22  22 - 32 mmol/L Final   Glucose, Bld 03/30/2023 94  70 - 99 mg/dL Final   Glucose reference range applies only to samples taken after fasting for at least 8 hours.   BUN 03/30/2023 <5 (L)  6 - 20 mg/dL Final   Creatinine, Ser 03/30/2023 0.71  0.44 - 1.00 mg/dL Final   Calcium 62/95/2841 9.3  8.9 - 10.3 mg/dL Final   Total Protein 32/44/0102 7.9  6.5 - 8.1 g/dL Final   Albumin 72/53/6644 4.4  3.5 - 5.0 g/dL Final   AST 03/47/4259 24  15 - 41 U/L Final   ALT 03/30/2023 18  0 - 44 U/L Final   Alkaline Phosphatase 03/30/2023 74  38 - 126 U/L Final   Total Bilirubin 03/30/2023 0.6  0.3 - 1.2 mg/dL Final   GFR, Estimated 03/30/2023 >60  >60 mL/min Final   Comment: (NOTE) Calculated using the CKD-EPI Creatinine Equation (2021)    Anion gap 03/30/2023 13  5 - 15 Final   Performed at [facility], [address], [place], [place] [address]   Hgb A1c MFr Bld  03/30/2023 5.1  4.8 - 5.6 % Final   Comment: (NOTE) Pre diabetes:  5.7%-6.4%  Diabetes:              >6.4%  Glycemic control for   <7.0% adults with diabetes    Mean Plasma Glucose 03/30/2023 99.67  mg/dL Final   Performed at [facility], [address], [place], [place] [address]   Alcohol, Ethyl (B) 03/30/2023 <10  <10 mg/dL Final   Comment: (NOTE) Lowest detectable limit for serum alcohol is 10 mg/dL.  For medical purposes only. Performed at [facility], [address] [place], [place] [address]    Cholesterol 03/30/2023 217 (H)  0 - 200 mg/dL Final   Triglycerides 13/02/6577 41  <150 mg/dL Final   HDL 46/96/2952 89  >40 mg/dL Final   Total CHOL/HDL Ratio 03/30/2023 2.4  RATIO Final   VLDL 03/30/2023 8  0 - 40 mg/dL Final   LDL Cholesterol 03/30/2023 120 (H)  0 - 99 mg/dL Final   Comment:        Total Cholesterol/HDL:CHD Risk Coronary Heart Disease Risk Table                     Men   Women  1/2 Average Risk   3.4   3.3  Average Risk       5.0   4.4  2 X Average Risk   9.6   7.1  3 X Average Risk  23.4   11.0        Use the calculated Patient Ratio above and the CHD Risk Table to determine the patient's CHD Risk.        ATP III CLASSIFICATION (LDL):  <100     mg/dL   Optimal  841-324  mg/dL   Near or Above                    Optimal  130-159  mg/dL   Borderline  401-027  mg/dL   High  >253     mg/dL   Very High Performed at [facility], [address] [place], [place] [address]    TSH 03/30/2023 0.823  0.350 - 4.500 uIU/mL Final   Comment: Performed by a 3rd Generation assay with a functional sensitivity of <=0.01 uIU/mL. Performed at [facility], [address], [place], [place] [address]    Preg Test, Ur 03/30/2023 Negative  Negative Final   POC Amphetamine UR 03/30/2023 None Detected  NONE DETECTED (Cut Off Level 1000 ng/mL) Final   POC Secobarbital (BAR) 03/30/2023 None Detected  NONE DETECTED (Cut Off Level 300 ng/mL)  Final   POC Buprenorphine (BUP) 03/30/2023 None Detected  NONE DETECTED (Cut Off Level 10 ng/mL) Final   POC Oxazepam (BZO) 03/30/2023 Positive (A)  NONE DETECTED (Cut Off Level 300 ng/mL) Final   POC Cocaine UR 03/30/2023 None Detected  NONE DETECTED (Cut Off Level 300 ng/mL) Final   POC Methamphetamine UR 03/30/2023 None Detected  NONE DETECTED (Cut Off Level 1000 ng/mL) Final   POC Morphine 03/30/2023 None Detected  NONE DETECTED (Cut Off Level 300 ng/mL) Final   POC Methadone UR 03/30/2023 None Detected  NONE DETECTED (Cut Off Level 300 ng/mL) Final   POC Oxycodone UR 03/30/2023 None Detected  NONE DETECTED (Cut Off Level 100 ng/mL) Final   POC Marijuana UR 03/30/2023 None Detected  NONE DETECTED (Cut Off Level 50 ng/mL) Final  Admission on 03/12/2023, Discharged on 03/12/2023  Component Date Value Ref Range Status   WBC 03/12/2023 9.1  4.0 - 10.5 K/uL Final   RBC 03/12/2023 4.49  3.87 - 5.11 MIL/uL Final   Hemoglobin 03/12/2023 13.8  12.0 - 15.0 g/dL Final   HCT 81/19/1478 39.8  36.0 - 46.0 % Final   MCV 03/12/2023 88.6  80.0 - 100.0 fL Final   MCH 03/12/2023 30.7  26.0 - 34.0 pg Final   MCHC 03/12/2023 34.7  30.0 - 36.0 g/dL Final   RDW 29/56/2130 11.9  11.5 - 15.5 % Final   Platelets 03/12/2023 235  150 - 400 K/uL Final   nRBC 03/12/2023 0.0  0.0 - 0.2 % Final   Neutrophils Relative % 03/12/2023 73  % Final   Neutro Abs 03/12/2023 6.7  1.7 - 7.7 K/uL Final   Lymphocytes Relative 03/12/2023 17  % Final   Lymphs Abs 03/12/2023 1.6  0.7 - 4.0 K/uL Final   Monocytes Relative 03/12/2023 8  % Final   Monocytes Absolute 03/12/2023 0.7  0.1 - 1.0 K/uL Final   Eosinophils Relative 03/12/2023 1  % Final   Eosinophils Absolute 03/12/2023 0.1  0.0 - 0.5 K/uL Final   Basophils Relative 03/12/2023 1  % Final   Basophils Absolute 03/12/2023 0.1  0.0 - 0.1 K/uL Final   Immature Granulocytes 03/12/2023 0  % Final   Abs Immature Granulocytes 03/12/2023 0.03  0.00 - 0.07 K/uL Final   Performed  at [facility], [address] [place], [place] [address]   Sodium 03/12/2023 137  135 - 145 mmol/L Final   Potassium 03/12/2023 3.3 (L)  3.5 - 5.1 mmol/L Final   Chloride 03/12/2023 109  98 - 111 mmol/L Final   CO2 03/12/2023 19 (L)  22 - 32 mmol/L Final   Glucose, Bld 03/12/2023 110 (H)  70 - 99 mg/dL Final   Glucose reference range applies only to samples taken after fasting for at least 8 hours.   BUN 03/12/2023 11  6 - 20 mg/dL Final   Creatinine, Ser 03/12/2023 0.61  0.44 - 1.00 mg/dL Final   Calcium 46/96/2952 8.9  8.9 - 10.3 mg/dL Final   Total Protein 84/13/2440 7.2  6.5 - 8.1 g/dL Final   Albumin 05/04/2535 4.0  3.5 - 5.0 g/dL Final   AST 64/40/3474 47 (H)  15 - 41 U/L Final   ALT 03/12/2023 21  0 - 44 U/L Final   Alkaline Phosphatase 03/12/2023 66  38 - 126 U/L Final   Total Bilirubin 03/12/2023 1.3 (H)  0.3 - 1.2 mg/dL Final   GFR, Estimated 03/12/2023 >60  >60 mL/min Final   Comment: (NOTE) Calculated using the CKD-EPI Creatinine Equation (2021)    Anion gap 03/12/2023 9  5 - 15 Final   Performed at [facility], [address] [place], [place] [address]   Lipase 03/12/2023 32  11 - 51 U/L Final   Performed at [facility], [address], [place], [place] [address]   TSH 03/12/2023 2.638  0.350 - 4.500 uIU/mL Final   Comment: Performed by a 3rd Generation assay with a functional sensitivity of <=0.01 uIU/mL. Performed at [facility], [address] [place], [place] [address]     Blood Alcohol level:  Lab Results  Component Value Date   [facility] <10 03/30/2023    Metabolic Disorder Labs: Lab Results  Component Value Date   HGBA1C 5.1 03/30/2023   MPG 99.67 03/30/2023   Lab Results  Component Value Date   PROLACTIN 12.6 05/17/2020   Lab  Results  Component Value Date   CHOL 217 (H) 03/30/2023   TRIG 41 03/30/2023   HDL 89 03/30/2023   CHOLHDL 2.4 03/30/2023   VLDL 8 03/30/2023    LDLCALC 120 (H) 03/30/2023   LDLCALC 91 08/13/2021    Therapeutic Lab Levels: No results found for: "LITHIUM" No results found for: "VALPROATE" No results found for: "CBMZ"  Physical Findings   AUDIT    Flowsheet Row ED from 03/30/2023 in [facility]  Alcohol Use Disorder Identification Test Final Score (AUDIT) 19      PHQ2-9    Flowsheet Row ED from 03/30/2023 in [facility]  PHQ-2 Total Score 3  PHQ-9 Total Score 18      Flowsheet Row ED from 03/30/2023 in [facility] Most recent reading at 03/30/2023  2:25 PM ED from 03/30/2023 in [facility] Most recent reading at 03/30/2023  7:23 AM ED from 03/12/2023 in [facility] Emergency Department at [facility] Most recent reading at 03/12/2023  6:36 AM  C-SSRS RISK CATEGORY No Risk No Risk No Risk        Musculoskeletal  Strength & Muscle Tone: within normal limits Gait & Station: normal Patient leans: N/A  Psychiatric Specialty Exam  Presentation  General Appearance:  Appropriate for Environment  Eye Contact: Good  Speech: Clear and Coherent; Normal Rate  Speech Volume: Normal  Handedness: Right   Mood and Affect  Mood: Euthymic  Affect: Appropriate; Congruent   Thought Process  Thought Processes: Coherent; Goal Directed; Linear  Descriptions of Associations:Intact  Orientation:Full (Time, Place and Person)  Thought Content:Logical; WDL  Diagnosis of Schizophrenia or Schizoaffective disorder in past: No    Hallucinations:Hallucinations: None  Ideas of Reference:None  Suicidal Thoughts:Suicidal Thoughts: No  Homicidal Thoughts:Homicidal Thoughts: No   Sensorium  Memory: Immediate Good; Immediate Fair  Judgment: Fair  Insight: Fair   Executive Functions  Concentration: Good  Attention Span: Good  Recall: Good  Fund of  Knowledge: Good  Language: Good   Psychomotor Activity  Psychomotor Activity: Psychomotor Activity: Normal   Assets  Assets: Communication Skills; Desire for Improvement; Financial Resources/Insurance; Housing; Intimacy; Physical Health; Resilience; Social Support; Vocational/Educational; Talents/Skills; Transportation   Sleep  Sleep: Sleep: Good     Physical Exam  Physical Exam Vitals reviewed.  Constitutional:      Appearance: Normal appearance.  HENT:     Head: Normocephalic and atraumatic.  Cardiovascular:     Rate and Rhythm: Normal rate.  Pulmonary:     Effort: Pulmonary effort is normal.  Musculoskeletal:        General: Normal range of motion.  Neurological:     General: No focal deficit present.     Mental Status: She is alert and oriented to person, place, and time.    Review of Systems  Constitutional:  Negative for chills, diaphoresis, fever and malaise/fatigue.  HENT:  Negative for tinnitus.   Respiratory:  Negative for cough and hemoptysis.   Cardiovascular:  Negative for chest pain and palpitations.  Gastrointestinal:  Negative for diarrhea, nausea and vomiting.  Musculoskeletal:  Negative for myalgias and neck pain.  Neurological:  Negative for dizziness and headaches.   Blood pressure 112/76, pulse 77, temperature 98.6 F (37 C), temperature source Oral, resp. rate 17, SpO2 99%. There is no height or weight on file to calculate BMI.  Treatment Plan Summary:  Alcohol Use Disorder Alcohol Withdrawal -Continue Librium taper -Thiamine 100 mg  IM first day and PO after that -Multivitamin with minerals daily -Tylenol 650 mg every 6 hours as needed for pain -Zofran 4 mg every 6 hours as needed for nausea or vomiting -Imodium 2 to 4 mg as needed for diarrhea or loose stools  -Maalox/Mylanta 30 mL every 4 hours as needed for indigestion -Milk of Mag 30 mL as needed for constipation  Substance Induced Mood Disorder Panic disorder -Continue  Lexapro 10 mg PRN medications: -Hydroxyzine 25 mg q6h PRN for anxiety -Trazodone 50 mg qhs PRN for sleep   Dispo:  -Home with OP resources -Plan for discharge tomorrow (9/25) after morning Librium dose -Psychiatry appointment on 9/30 -Therapy appointment on 10/1  [place] E Swandell, Medical Student 04/01/2023 10:56 AM  I personally was present and performed or re-performed the history and medical decision-making activities of this service and have verified that the service and findings are accurately documented in the student's note.  Kizzie Ide, MD, PGY-2

## 2023-04-01 NOTE — Group Note (Unsigned)
Group Topic: Wellness  Group Date: 04/01/2023 Start Time: 2000 End Time: 2030 Facilitators: Guss Bunde  Department: [facility]  Number of Participants: 4  Group Focus: check in Treatment Modality:  Psychoeducation Interventions utilized were support Purpose: reinforce self-care   Name: ARDENIA REIVES Date of Birth: [birth year]  MR: [id number]    Level of Participation: {THERAPIES; PSYCH GROUP PARTICIPATION [id number]:19147} Quality of Participation: {THERAPIES; PSYCH QUALITY OF PARTICIPATION:23992} Interactions with others: {THERAPIES; PSYCH INTERACTIONS:23993} Mood/Affect: {THERAPIES; PSYCH MOOD/AFFECT:23994} Triggers (if applicable): *** Cognition: {THERAPIES; PSYCH COGNITION:23995} Progress: {THERAPIES; PSYCH PROGRESS:23997} Response: *** Plan: {THERAPIES; PSYCH WGNF:62130}  Patients Problems:  Patient Active Problem List   Diagnosis Date Noted   Alcohol dependence (HCC) 03/30/2023   GAD (generalized anxiety disorder) 04/19/2011

## 2023-04-02 DIAGNOSIS — F10230 Alcohol dependence with withdrawal, uncomplicated: Secondary | ICD-10-CM | POA: Diagnosis not present

## 2023-04-02 MED ORDER — HYDROXYZINE HCL 25 MG PO TABS: 25.0000 mg | ORAL_TABLET | Freq: Three times a day (TID) | ORAL | 0 refills | Status: DC | PRN

## 2023-04-02 NOTE — Discharge Instructions (Signed)

## 2023-04-02 NOTE — Progress Notes (Signed)
Pt is awake, alert and oriented X3. Pt did not voice any complaints of pain or discomfort. No signs of acute distress noted. Administered scheduled meds per order. Pt denies current SI/HI/AVH, plan or intent. Staff will monitor for pt's safety.

## 2023-04-02 NOTE — Discharge Summary (Signed)
Victoria Nash to be D/C'd Home per MD order. Discussed with the patient and all questions fully answered. An After Visit Summary was printed and given to the patient. Medication script was also given to patient. All belongings returned. Patient escorted out, and D/C home via private auto.  Dickie La  04/02/2023 10:08 AM

## 2023-04-02 NOTE — ED Notes (Signed)
Patient is sleeping. Respirations equal and unlabored, skin warm and dry. No change in assessment or acuity. Routine safety checks conducted according to facility protocol. Will continue to monitor for safety.   

## 2023-04-02 NOTE — ED Provider Notes (Signed)
FBC/OBS ASAP Discharge Summary  Date and Time: 04/02/2023 9:39 AM  Name: Victoria Nash  MRN:  [id number]   Discharge Diagnoses:  Final diagnoses:  Alcohol dependence with uncomplicated withdrawal (HCC)  Panic attacks  Alcohol-induced mood disorder (HCC)    Subjective: Victoria Nash is a 42 y.o. female patient with a past history significant for alcohol dependence and panic attacks who presents to the [facility] for alcohol use detox and resources.   Patient seen this AM. She reports feeling much better compared to when she first came to the [facility]. She notes good sleep and appetite. She denies withdrawal symptoms or adverse effects from her medications. She agrees to follow up with OP psychiatry and therapy for her appointments. She feels motivated to change her environment and behaviors so she will stay sober.  Stay Summary: The patient was evaluated each day by a clinical provider to ascertain response to treatment. Improvement was noted by the patient's report of decreasing symptoms, improved sleep and appetite, affect, medication tolerance, behavior, and participation in unit programming.  Patient was asked each day to complete a self inventory noting mood, mental status, pain, new symptoms, anxiety and concerns.  The patient's medications were managed with the following directions: Restarted home Lexapro Librium taper  Patient responded well to medication and being in a therapeutic and supportive environment. Positive and appropriate behavior was noted and the patient was motivated for recovery. The patient worked closely with the treatment team and case manager to develop a discharge plan with appropriate goals. Coping skills, problem solving as well as relaxation therapies were also part of the unit programming.  Total Time spent with patient: 30 minutes  Past Psychiatric History: panic disorder, AUD Past Medical History: Denies Family History: AUD with brother Social History: Lives in [place]  with husband and two daughters, ages 23 and 48. 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 Support system includes her husband, mother, and friends at church. Denies legal issues and access to firearms Tobacco Cessation:  N/A, patient does not currently use tobacco products  Current Medications:  Current Facility-Administered Medications  Medication Dose Route Frequency Provider Last Rate Last Admin   acetaminophen (TYLENOL) tablet 650 mg  650 mg Oral Q6H PRN White, Patrice L, NP       alum & mag hydroxide-simeth (MAALOX/MYLANTA) 200-200-20 MG/5ML suspension 30 mL  30 mL Oral Q4H PRN White, Patrice L, NP       chlordiazePOXIDE (LIBRIUM) capsule 25 mg  25 mg Oral Q6H PRN White, Patrice L, NP       escitalopram (LEXAPRO) tablet 10 mg  10 mg Oral Daily White, Patrice L, NP   10 mg at 04/02/23 4098   hydrOXYzine (ATARAX) tablet 25 mg  25 mg Oral TID PRN Liborio Nixon L, NP   25 mg at 03/31/23 2138   loperamide (IMODIUM) capsule 2-4 mg  2-4 mg Oral PRN White, Patrice L, NP       magnesium hydroxide (MILK OF MAGNESIA) suspension 30 mL  30 mL Oral Daily PRN White, Patrice L, NP       multivitamin with minerals tablet 1 tablet  1 tablet Oral Daily White, Patrice L, NP   1 tablet at 04/02/23 0826   ondansetron (ZOFRAN-ODT) disintegrating tablet 4 mg  4 mg Oral Q6H PRN White, Patrice L, NP       thiamine (VITAMIN B1) tablet 100 mg  100 mg Oral Daily White, Patrice L, NP   100 mg at 04/02/23 0825  traZODone (DESYREL) tablet 50 mg  50 mg Oral QHS PRN White, Patrice L, NP       Current Outpatient Medications  Medication Sig Dispense Refill   Calcium-Magnesium-Vitamin D (CALCIUM 500 PO) Take 1 tablet by mouth daily.     escitalopram (LEXAPRO) 10 MG tablet Take 1.5 tablets (15 mg) by mouth daily. 135 tablet 0   hydrOXYzine (ATARAX) 25 MG tablet Take 1 tablet (25 mg total) by mouth 3 (three) times daily as needed for anxiety. 60 tablet 0   Multiple Vitamins-Minerals (MULTI COMPLETE PO) Take 1 tablet  by mouth daily.     tretinoin (ALTRALIN) 0.05 % gel Apply 1 Application topically at bedtime.      PTA Medications:  Facility Ordered Medications  Medication   [COMPLETED] thiamine (VITAMIN B1) injection 100 mg   acetaminophen (TYLENOL) tablet 650 mg   alum & mag hydroxide-simeth (MAALOX/MYLANTA) 200-200-20 MG/5ML suspension 30 mL   magnesium hydroxide (MILK OF MAGNESIA) suspension 30 mL   hydrOXYzine (ATARAX) tablet 25 mg   traZODone (DESYREL) tablet 50 mg   multivitamin with minerals tablet 1 tablet   chlordiazePOXIDE (LIBRIUM) capsule 25 mg   loperamide (IMODIUM) capsule 2-4 mg   ondansetron (ZOFRAN-ODT) disintegrating tablet 4 mg   [COMPLETED] chlordiazePOXIDE (LIBRIUM) capsule 25 mg   Followed by   [COMPLETED] chlordiazePOXIDE (LIBRIUM) capsule 25 mg   Followed by   [COMPLETED] chlordiazePOXIDE (LIBRIUM) capsule 25 mg   Followed by   [COMPLETED] chlordiazePOXIDE (LIBRIUM) capsule 25 mg   thiamine (VITAMIN B1) tablet 100 mg   escitalopram (LEXAPRO) tablet 10 mg   PTA Medications  Medication Sig   Multiple Vitamins-Minerals (MULTI COMPLETE PO) Take 1 tablet by mouth daily.   Calcium-Magnesium-Vitamin D (CALCIUM 500 PO) Take 1 tablet by mouth daily.   tretinoin (ALTRALIN) 0.05 % gel Apply 1 Application topically at bedtime.   escitalopram (LEXAPRO) 10 MG tablet Take 1.5 tablets (15 mg) by mouth daily.   hydrOXYzine (ATARAX) 25 MG tablet Take 1 tablet (25 mg total) by mouth 3 (three) times daily as needed for anxiety.       04/02/2023    8:42 AM 03/30/2023    2:24 PM  Depression screen PHQ 2/9  Decreased Interest 1 2  Down, Depressed, Hopeless 1 1  PHQ - 2 Score 2 3  Altered sleeping 2 2  Tired, decreased energy 3 3  Change in appetite 3 3  Feeling bad or failure about yourself  2 2  Trouble concentrating 2 2  Moving slowly or fidgety/restless 3 3  Suicidal thoughts 0 0  PHQ-9 Score 17 18    Flowsheet Row ED from 03/30/2023 in [facility] Most recent reading at 03/30/2023  2:25 PM ED from 03/30/2023 in [facility] Most recent reading at 03/30/2023  7:23 AM ED from 03/12/2023 in [facility] Most recent reading at 03/12/2023  6:36 AM  C-SSRS RISK CATEGORY No Risk No Risk No Risk       Musculoskeletal  Strength & Muscle Tone: within normal limits Gait & Station: normal Patient leans: N/A  Psychiatric Specialty Exam  Presentation  General Appearance:  Appropriate for Environment  Eye Contact: Good  Speech: Clear and Coherent  Speech Volume: Normal  Handedness: Right   Mood and Affect  Mood: Euthymic  Affect: Congruent; Appropriate   Thought Process  Thought Processes: Coherent; Goal Directed; Linear  Descriptions of Associations:Intact  Orientation:Full (Time, Place and Person)  Thought Content:Logical; WDL  Diagnosis of Schizophrenia or Schizoaffective disorder in past: No    Hallucinations:Hallucinations: None  Ideas of Reference:None  Suicidal Thoughts:Suicidal Thoughts: No  Homicidal Thoughts:Homicidal Thoughts: No   Sensorium  Memory: Remote Good  Judgment: Good  Insight: Good   Executive Functions  Concentration: Good  Attention Span: Good  Recall: Good  Fund of Knowledge: Good  Language: Good   Psychomotor Activity  Psychomotor Activity: Psychomotor Activity: Normal   Assets  Assets: Communication Skills; Desire for Improvement; Resilience; Social Support; Intimacy; Housing; Transportation; Physical Health   Sleep  Sleep: Sleep: Good    Physical Exam  Physical Exam Vitals reviewed.  Constitutional:      Appearance: Normal appearance.  HENT:     Head: Normocephalic and atraumatic.  Cardiovascular:     Rate and Rhythm: Normal rate.  Pulmonary:     Effort: Pulmonary effort is normal.  Musculoskeletal:        General: Normal range of motion.  Neurological:      Mental Status: She is alert.    Review of Systems  Constitutional:  Negative for chills and fever.  Cardiovascular:  Negative for chest pain and palpitations.  Gastrointestinal:  Negative for nausea and vomiting.  Musculoskeletal:  Negative for myalgias and neck pain.  Neurological:  Negative for tingling and headaches.   Blood pressure 117/84, pulse 70, temperature 98.4 F (36.9 C), temperature source Oral, resp. rate 18, SpO2 99%. There is no height or weight on file to calculate BMI.  Demographic Factors:  Caucasian  Loss Factors: NA  Historical Factors: NA  Risk Reduction Factors:   Responsible for children under 20 years of age, Sense of responsibility to family, Religious beliefs about death, Employed, Living with another person, especially a relative, Positive social support, Positive therapeutic relationship, and Positive coping skills or problem solving skills  Continued Clinical Symptoms:  Panic Attacks Alcohol/Substance Abuse/Dependencies  Cognitive Features That Contribute To Risk:  None    Suicide Risk:  Minimal: No identifiable suicidal ideation.  Patients presenting with no risk factors but with morbid ruminations; may be classified as minimal risk based on the severity of the depressive symptoms  Plan Of Care/Follow-up recommendations:  Activity as tolerated Regular diet Continue prescription medications See PCP for medical conditions   Disposition: Home with OP srevices  Lance Muss, MD 04/02/2023, 9:39 AM

## 2023-04-07 ENCOUNTER — Other Ambulatory Visit: Payer: Self-pay

## 2023-04-07 ENCOUNTER — Ambulatory Visit (HOSPITAL_BASED_OUTPATIENT_CLINIC_OR_DEPARTMENT_OTHER): Payer: 59 | Admitting: Family

## 2023-04-07 ENCOUNTER — Encounter (HOSPITAL_COMMUNITY): Payer: Self-pay | Admitting: Family

## 2023-04-07 VITALS — BP 113/78 | HR 76 | Ht 67.0 in | Wt 178.0 lb

## 2023-04-07 DIAGNOSIS — F1020 Alcohol dependence, uncomplicated: Secondary | ICD-10-CM | POA: Diagnosis not present

## 2023-04-07 DIAGNOSIS — F411 Generalized anxiety disorder: Secondary | ICD-10-CM | POA: Diagnosis not present

## 2023-04-07 MED ORDER — ESCITALOPRAM OXALATE 20 MG PO TABS
20.0000 mg | ORAL_TABLET | Freq: Every day | ORAL | 2 refills | Status: DC
Start: 2023-04-07 — End: 2023-07-14

## 2023-04-07 NOTE — Addendum Note (Signed)
Addended by: Oneta Rack on: 04/07/2023 12:25 PM   Modules accepted: Orders

## 2023-04-07 NOTE — Addendum Note (Signed)
Addended by: Oneta Rack on: 04/07/2023 01:07 PM   Modules accepted: Level of Service
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
## 2023-04-08 ENCOUNTER — Ambulatory Visit (INDEPENDENT_AMBULATORY_CARE_PROVIDER_SITE_OTHER): Payer: 59 | Admitting: Licensed Clinical Social Worker

## 2023-04-08 ENCOUNTER — Encounter (HOSPITAL_COMMUNITY): Payer: Self-pay

## 2023-04-08 DIAGNOSIS — F1020 Alcohol dependence, uncomplicated: Secondary | ICD-10-CM

## 2023-04-08 DIAGNOSIS — F411 Generalized anxiety disorder: Secondary | ICD-10-CM

## 2023-04-08 NOTE — Progress Notes (Signed)
THERAPIST PROGRESS NOTE  Session Time: 1:05 p.m. to 2:05 p.m.   Type of Therapy: Individual   Therapist Response/Interventions: Solution Focused/The therapist educates Victoria Nash about alcoholism as a disease versus a character defect and about the continuum of care. He explains to her that research indicates that recovery takes place in Fellowship thus talking to her about attending AA. He discusses the need to avoid triggers in the form of people, places, and things.   Treatment Goals addressed:  Active     Alcohol Use and Mood Disorder     Victoria Nash will report being able to maintain sobriety on a daily basis per self-report and random breathalyzer as needed while attending AA at least once a week.  (Initial)     Start:  04/08/23    Expected End:  10/07/23         Victoria Nash will report a reduction in her depression and anxiety as evidenced by her PHQ-9 and GAD-7 both being a 4 or less.  (Initial)     Start:  04/08/23    Expected End:  10/07/23         Therapist will assist Victoria Nash in identifying and avoiding triggers for drinking and in overcoming any obstacles to obtaining sober supports.      Start:  04/08/23         Therapist will help Victoria Nash to identify and change thoughts and behaviors contributing to her depression and anxiety.      Start:  04/08/23      Victoria Nash gives this therapist permission to electronically sign this Care Plan on her behalf.          Summary: Victoria Nash presents for her initial therapy session with this therapist having discharged from detox at the [facility] about six days ago. She went to detox as she wanted to stop drinking but feared the possible withdrawal as her brother passed away from an alcohol-related death in 07/31/22.   Prior to stopping, Victoria Nash says that she was drinking one or two bottles of wine per day since COVID noting that her drinking was getting progressively worse over time. She reports having "suffered from anxiety" her entire life and is schedule  to see a therapist for this at the end of October, Ms. Sharrie Rothman, LCSW. She realized that the drinking was likely exacerbating her anxiety so decided to stop.  She denies any prior behavioral health treatment. She grew up [place] saying that she had a positive childhood until the age of 81 or 84 day at which time her father lost is companies and their house. Her parents had marital difficulties and eventually divorced when Victoria Nash was in college. Her father was a "functional alcoholic." She says that her maternal grandmother had problems with depression.  Victoria Nash has been married since 2006 noting that her husband is supportive and seldom drinks. She recognizes that her "friend group" could be an issue in regards to her remaining sober.   By session's end, she says that she will try an AA meeting at a [facility] near where she lives and will get the alcohol out of her house.   Progress Towards Goals: Initial  Suicidal/Homicidal: No SI or HI  Plan: Return again in 1 weeks and will try out an in person AA meeting near her house.   Diagnosis: Uncomplicated Alcohol Dependence and GAD; R/O Major Depression  Collaboration of Care: Other N/A  Patient/Guardian was advised Release of Information must be obtained prior to any record release in order to collaborate  their care with an outside provider. Patient/Guardian was advised if they have not already done so to contact the registration department to sign all necessary forms in order for [place] to release information regarding their care.   Consent: Patient/Guardian gives verbal consent for treatment and assignment of benefits for services provided during this visit. Patient/Guardian expressed understanding and agreed to proceed.   Victoria Blazer, MA, LCSW, [facility], LCAS 04/08/2023

## 2023-04-17 ENCOUNTER — Ambulatory Visit (INDEPENDENT_AMBULATORY_CARE_PROVIDER_SITE_OTHER): Payer: 59 | Admitting: Licensed Clinical Social Worker

## 2023-04-17 DIAGNOSIS — F1020 Alcohol dependence, uncomplicated: Secondary | ICD-10-CM

## 2023-04-17 DIAGNOSIS — F411 Generalized anxiety disorder: Secondary | ICD-10-CM

## 2023-04-17 NOTE — Progress Notes (Signed)
THERAPIST PROGRESS NOTE  Session Time: 1:04 p.m. to 2:03 p.m.   Type of Therapy: Individual   Therapist Response/Interventions: Solution Focused/The therapist addresses her fears about cravings suggesting that she have her friend go to a meeting with her so that she can handle any future cravings via calling her Sponsor or other people in the program for support.  The therapist observes that she could perhaps reduce her morning coffee but it does not seem excessive as she is not drinking coffee throughout the day. He notes that she is not only thinking about making changes but is making changes via working outside the house, exercising, and trying to increase social interaction; and encourages her to continue doing these things.    Treatment Goals addressed:  Active     Alcohol Use and Mood Disorder     Suvi will report being able to maintain sobriety on a daily basis per self-report and random breathalyzer as needed while attending AA at least once a week.  (Progressing)     Start:  04/08/23    Expected End:  10/07/23         Lonnetta will report a reduction in her depression and anxiety as evidenced by her PHQ-9 and GAD-7 both being a 4 or less.  (Progressing)     Start:  04/08/23    Expected End:  10/07/23         Therapist will assist Amenda in identifying and avoiding triggers for drinking and in overcoming any obstacles to obtaining sober supports.      Start:  04/08/23         Therapist will help Caoimhe to identify and change thoughts and behaviors contributing to her depression and anxiety.      Start:  04/08/23      Noelia gives this therapist permission to electronically sign this Care Plan on her behalf.             Summary: Keysi presents saying that she is doing well but is super-tired wondering if it might be her anti-depressant medication. She talks about her coffee consumption comparing it to when she drank wine mindlessly wondering if she should reduce it.  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 She has started walking with a friend on Fridays who is in recovery and concludes that she could ask her to attend a meeting with her as it would be easier if she had someone go with her.  Britnay admits that part of what drinking did for her was help her to not feel so anxious in group social interactions. She says that she went on a business trip in which people went out and had some drinks. Kendra says that she told them she was not drinking as it makes her anxiety worse feeling shame about telling them she is an alcoholic. She concludes that if she goes on another trip that she could skip these interactions.    She says that yesterday was the first day that she felt good enough that she could not think herself into a panic attack if she tried. She talks about her fears of having cravings in the future when she is less anxious.   Progress Towards Goals: Progressing  Suicidal/Homicidal: No SI or HI  Plan: Return again in 2 weeks   Diagnosis: Uncomplicated Alcohol Dependence and  GAD; R/O Major Depression  Collaboration of Care: Other N/A  Patient/Guardian was advised Release of Information must be obtained prior to any record release in order to collaborate their care with an outside provider. Patient/Guardian was advised if they have not already done so to contact the registration department to sign all necessary forms in order for [place] to release information regarding their care.   Consent: Patient/Guardian gives verbal consent for treatment and assignment of benefits for services provided during this visit. Patient/Guardian expressed understanding and agreed to proceed.   Myrna Blazer, MA, LCSW, [facility], LCAS 04/08/2023

## 2023-04-21 NOTE — Progress Notes (Deleted)
42 y.o. G87P2002 Married Caucasian female here for annual exam.    PCP: System, Provider Not In   No LMP recorded.           Sexually active: {yes no:314532}  The current method of family planning is vasectomy.    Exercising: {yes no:314532}  {types:19826} Smoker:  no  OB History  Gravida Para Term Preterm AB Living  2 2 2     2   SAB IAB Ectopic Multiple Live Births               # Outcome Date GA Lbr Len/2nd Weight Sex Type Anes PTL Lv  2 Term           1 Term              Health Maintenance: Pap:  01/23/18 neg: HR HPV neg History of abnormal Pap:  no MMG:  n/a Colonoscopy:   HM Colonoscopy     This patient has no relevant Health Maintenance data.      BMD:  n/a  Result  n/a  [id number] blood Hep C: donates blood  Immunization History  Administered Date(s) Administered   HPV 9-valent 05/17/2020, 07/18/2020, 11/16/2020   Influenza,inj,Quad PF,6+ Mos 05/08/2018, 08/13/2021   Tdap 06/14/2013     {Labs (Optional):23779}   reports that she has never smoked. She has never used smokeless tobacco. She reports that she does not currently use alcohol. She reports that she does not use drugs.  Past Medical History:  Diagnosis Date   Anxiety     No past surgical history on file.  Current Outpatient Medications  Medication Sig Dispense Refill   Calcium-Magnesium-Vitamin D (CALCIUM 500 PO) Take 1 tablet by mouth daily.     escitalopram (LEXAPRO) 20 MG tablet Take 1 tablet (20 mg total) by mouth daily. 30 tablet 2   hydrOXYzine (ATARAX) 25 MG tablet Take 1 tablet (25 mg total) by mouth 3 (three) times daily as needed for anxiety. 60 tablet 0   Multiple Vitamins-Minerals (MULTI COMPLETE PO) Take 1 tablet by mouth daily.     tretinoin (ALTRALIN) 0.05 % gel Apply 1 Application topically at bedtime.     No current facility-administered medications for this visit.    Family History  Problem Relation Age of Onset   Hypertension Father    Cancer Father        oral  cancer   Leukemia Father        lymphocytic   Dementia Father    Lung cancer Maternal Grandmother     Review of Systems  Exam:   There were no vitals taken for this visit.    General appearance: alert, cooperative and appears stated age Head: normocephalic, without obvious abnormality, atraumatic Neck: no adenopathy, supple, symmetrical, trachea midline and thyroid normal to inspection and palpation Lungs: clear to auscultation bilaterally Breasts: normal appearance, no masses or tenderness, No nipple retraction or dimpling, No nipple discharge or bleeding, No axillary adenopathy Heart: regular rate and rhythm Abdomen: soft, non-tender; no masses, no organomegaly Extremities: extremities normal, atraumatic, no cyanosis or edema Skin: skin color, texture, turgor normal. No rashes or lesions Lymph nodes: cervical, supraclavicular, and axillary nodes normal. Neurologic: grossly normal  Pelvic: External genitalia:  no lesions              No abnormal inguinal nodes palpated.              Urethra:  normal appearing urethra with no masses, tenderness or lesions  Bartholins and Skenes: normal                 Vagina: normal appearing vagina with normal color and discharge, no lesions              Cervix: no lesions              Pap taken: {yes no:314532} Bimanual Exam:  Uterus:  normal size, contour, position, consistency, mobility, non-tender              Adnexa: no mass, fullness, tenderness              Rectal exam: {yes no:314532}.  Confirms.              Anus:  normal sphincter tone, no lesions  Chaperone was present for exam:  {BSCHAPERONE:31226::"Corrine Tillis F, CMA"}   Assessment and Plan:   ***  Mammogram screening discussed. Self breast awareness reviewed. Guidelines for Calcium, Vitamin D, regular exercise program including cardiovascular and weight bearing exercise.   No follow-ups on file.   Additional counseling given.  {yes [id number]. _______ minutes face  to face time of which over 50% was spent in counseling.    After visit summary provided.

## 2023-04-29 ENCOUNTER — Ambulatory Visit (INDEPENDENT_AMBULATORY_CARE_PROVIDER_SITE_OTHER): Payer: 59 | Admitting: Licensed Clinical Social Worker

## 2023-04-29 DIAGNOSIS — F1020 Alcohol dependence, uncomplicated: Secondary | ICD-10-CM | POA: Diagnosis not present

## 2023-04-29 DIAGNOSIS — F411 Generalized anxiety disorder: Secondary | ICD-10-CM

## 2023-04-29 NOTE — Progress Notes (Signed)
THERAPIST PROGRESS NOTE  Session Time: 1:01 p.m. to 2:01 p.m.   Type of Therapy: Individual   Therapist Response/Interventions: Solution Focused/The therapist encourages her to attend some virtual meetings in addition to in person meetings and shows her a video by Orlin Hilding on the Psychology of Addiction debunking the idea that there are both soft and hard drugs and that beer and wine are somehow less harmful than liquor.   The therapist encourages her to keep walking noting that exercise helps to reduce the intensity and duration of PAWS. He emails her a like to Dr. Debbora Lacrosse  "New Perspectives on Addiction and Recovery" such that she will have a better understanding of the reason that she would drink when really not wanting to drink and the biology of addiction.   Treatment Goals addressed:  Active     Alcohol Use and Mood Disorder     Shravani will report being able to maintain sobriety on a daily basis per self-report and random breathalyzer as needed while attending AA at least once a week.  (Progressing)     Start:  04/08/23    Expected End:  10/07/23         Melene will report a reduction in her depression and anxiety as evidenced by her PHQ-9 and GAD-7 both being a 4 or less.  (Progressing)     Start:  04/08/23    Expected End:  10/07/23         Therapist will assist Matina in identifying and avoiding triggers for drinking and in overcoming any obstacles to obtaining sober supports.      Start:  04/08/23         Therapist will help Aram to identify and change thoughts and behaviors contributing to her depression and anxiety.      Start:  04/08/23      Jaskirat gives this therapist permission to electronically sign this Care Plan on her behalf.                Summary: Albert presents saying, "I'm feeling great" no longer having fatigue. She has continued walking daily. She has yet to attend a meeting but will attend tonight with her friend. She has been  listening to the "naked mind." She says that the book is helping her to focus on her beliefs about alcohol. Azailya met with her therapist saying that she wanted her to focus on self-care. Trameka has started trying to work on structuring her day such as meal prep.   The major focus of today's session involves Sanyiah's realization that her beliefs about alcohol were distorted. She admits that she still struggles with the belief that she will not be able to have fun without drinking.   Progress Towards Goals: Progressing  Suicidal/Homicidal: No SI or HI  Plan: Return again in 2 weeks   Diagnosis: Uncomplicated Alcohol Dependence and GAD; R/O Major Depression  Collaboration of Care: Other N/A  Patient/Guardian was advised Release of Information must be obtained prior to any record release in order to collaborate their care with an outside provider. Patient/Guardian was advised if they have not already done so to contact the registration department to sign all necessary forms in order for [place] to release information regarding their care.   Consent: Patient/Guardian gives verbal consent for treatment and assignment of benefits for services provided during this visit. Patient/Guardian expressed understanding and agreed to proceed.   Myrna Blazer, MA, LCSW, [facility], LCAS 04/29/2023

## 2023-05-05 ENCOUNTER — Telehealth (HOSPITAL_COMMUNITY): Payer: 59 | Admitting: Family

## 2023-05-05 ENCOUNTER — Ambulatory Visit: Payer: 59 | Admitting: Obstetrics and Gynecology

## 2023-05-05 DIAGNOSIS — F411 Generalized anxiety disorder: Secondary | ICD-10-CM

## 2023-05-05 NOTE — Progress Notes (Signed)
BH MD/PA/NP OP Progress Note  05/05/2023 9:38 AM Victoria Nash  MRN:  [id number]  Chief Complaint: Generalized anxiety disorder  HPI: Victoria Nash 42 year old Caucasian female presents for medication management.  During her initial assessment Lexapro 10 mg was increased to 20 mg daily which she reports she has been taking and tolerating well.  Reports overall her symptoms have improved.  States " I am feeling and doing a lot better."  She reports a good appetite.  States she is resting well throughout the night.  She denied any medication side effects to include headache nausea vomiting or diarrhea.  Patient has followed up therapist W. Garrot biweekly.  No documented concerns related to treatment plan.  Patient to follow-up for medication management 3 months.  Support, encouragement  and reassurance was provided.  Generalized anxiety disorder: Uncomplicated alcohol disorder:  Continue Lexapro 20 mg daily Continue hydroxyzine 25 mg p.o. 3 times daily  Victoria Nash was seen via video tele-assessment.  she is alert/oriented x 4, calm and cooperative and mood congruent with affect.  She presents pleasant with a bright affect.  Patient is speaking in a clear tone at moderate volume, and normal pace; with good eye contact. Her thought process is coherent and relevant; There is no indication that she is currently responding to internal/external stimuli or experiencing delusional thought content.  Patient denies suicidal/self-harm/homicidal ideation, psychosis, and paranoia.  Patient has remained calm throughout assessment and has answered questions appropriately.   Visit Diagnosis:    ICD-10-CM   1. GAD (generalized anxiety disorder)  F41.1       Past Psychiatric History:   Past Medical History:  Past Medical History:  Diagnosis Date   Anxiety    No past surgical history on file.  Family Psychiatric History:   Family History:  Family History  Problem Relation Age of Onset    Hypertension Father    Cancer Father        oral cancer   Leukemia Father        lymphocytic   Dementia Father    Lung cancer Maternal Grandmother     Social History:  Social History   Socioeconomic History   Marital status: Married    Spouse name: Not on file   Number of children: Not on file   Years of education: Not on file   Highest education level: Not on file  Occupational History   Not on file  Tobacco Use   Smoking status: Never   Smokeless tobacco: Never  Vaping Use   Vaping status: Never Used  Substance and Sexual Activity   Alcohol use: Not Currently   Drug use: No   Sexual activity: Yes    Partners: Male    Birth control/protection: Other-see comments    Comment: vasectomy  Other Topics Concern   Not on file  Social History Narrative   Not on file   Social Determinants of Health   Financial Resource Strain: Not on file  Food Insecurity: No Food Insecurity (03/30/2023)   Hunger Vital Sign    Worried About Running Out of Food in the Last Year: Never true    Ran Out of Food in the Last Year: Never true  Transportation Needs: No Transportation Needs (03/30/2023)   PRAPARE - Administrator, Civil Service (Medical): No    Lack of Transportation (Non-Medical): No  Physical Activity: Not on file  Stress: Not on file  Social Connections: Not on file    Allergies:  No Known Allergies  Metabolic Disorder Labs: Lab Results  Component Value Date   HGBA1C 5.1 03/30/2023   MPG 99.67 03/30/2023   Lab Results  Component Value Date   PROLACTIN 12.6 05/17/2020   Lab Results  Component Value Date   CHOL 217 (H) 03/30/2023   TRIG 41 03/30/2023   HDL 89 03/30/2023   CHOLHDL 2.4 03/30/2023   VLDL 8 03/30/2023   LDLCALC 120 (H) 03/30/2023   LDLCALC 91 08/13/2021   Lab Results  Component Value Date   TSH 0.823 03/30/2023   TSH 2.638 03/12/2023    Therapeutic Level Labs: No results found for: "LITHIUM" No results found for: "VALPROATE" No  results found for: "CBMZ"  Current Medications: Current Outpatient Medications  Medication Sig Dispense Refill   Calcium-Magnesium-Vitamin D (CALCIUM 500 PO) Take 1 tablet by mouth daily.     escitalopram (LEXAPRO) 20 MG tablet Take 1 tablet (20 mg total) by mouth daily. 30 tablet 2   hydrOXYzine (ATARAX) 25 MG tablet Take 1 tablet (25 mg total) by mouth 3 (three) times daily as needed for anxiety. 60 tablet 0   Multiple Vitamins-Minerals (MULTI COMPLETE PO) Take 1 tablet by mouth daily.     tretinoin (ALTRALIN) 0.05 % gel Apply 1 Application topically at bedtime.     No current facility-administered medications for this visit.     Musculoskeletal: Strength & Muscle Tone: within normal limits Gait & Station: normal Patient leans: N/A  Psychiatric Specialty Exam: Review of Systems  Psychiatric/Behavioral:  Negative for sleep disturbance. The patient is not nervous/anxious.   All other systems reviewed and are negative.   There were no vitals taken for this visit.There is no height or weight on file to calculate BMI.  General Appearance: Casual  Eye Contact:  Good  Speech:  Clear and Coherent  Volume:  Normal  Mood:  Anxious and Depressed  Affect:  Congruent  Thought Process:  Coherent  Orientation:  Full (Time, Place, and Person)  Thought Content: Logical   Suicidal Thoughts:  No  Homicidal Thoughts:  No  Memory:  Immediate;   Good Recent;   Good  Judgement:  Good  Insight:  Good  Psychomotor Activity:  Normal  Concentration:  Concentration: Good  Recall:  Good  Fund of Knowledge: Good  Language: Good  Akathisia:  No  Handed:  Right  AIMS (if indicated): done  Assets:  Communication Skills Desire for Improvement Resilience Social Support  ADL's:  Intact  Cognition: WNL  Sleep:  Good   Screenings: AUDIT    Flowsheet Row ED from 03/30/2023 in [facility]  Alcohol Use Disorder Identification Test Final Score (AUDIT) 19       PHQ2-9    Flowsheet Row Office Visit from 04/07/2023 in [facility] ED from 03/30/2023 in [facility]  PHQ-2 Total Score 5 2  PHQ-9 Total Score 13 17      Flowsheet Row Office Visit from 04/07/2023 in [facility] Most recent reading at 04/07/2023 12:20 PM ED from 03/30/2023 in [facility] Most recent reading at 03/30/2023  2:25 PM ED from 03/30/2023 in [facility] Most recent reading at 03/30/2023  7:23 AM  C-SSRS RISK CATEGORY No Risk No Risk No Risk        Assessment and Plan: Victoria Nash 60-year-old Caucasian female presents for medication management.  Currently taking Lexapro 20 mg daily which she reports taking  and tolerating well.  Discussed following up every 3 months for medication adherence/tolerability.  She was receptive to plan.  Patient to continue biweekly follow-up appointment with therapist due to alcohol misuse disorder.  No other documented concerns noted at this visit.  Dashawn's mood appears to be improving and stabilizing.  Patient is progressing in treatment.  Support,encouragement  and reassurance was provided.  Generalized anxiety disorder: Uncomplicated alcohol disorder:  Continue Lexapro 20 mg daily Continue hydroxyzine 25 mg p.o. 3 times daily  Collaboration of Care: Collaboration of Care: Medication Management AEB Prozac  Patient/Guardian was advised Release of Information must be obtained prior to any record release in order to collaborate their care with an outside provider. Patient/Guardian was advised if they have not already done so to contact the registration department to sign all necessary forms in order for [place] to release information regarding their care.   Consent: Patient/Guardian gives verbal consent for treatment and assignment of benefits for services provided during this visit.  Patient/Guardian expressed understanding and agreed to proceed.    Oneta Rack, NP 05/05/2023, 9:38 AM

## 2023-05-15 ENCOUNTER — Ambulatory Visit (HOSPITAL_COMMUNITY): Payer: 59 | Admitting: Licensed Clinical Social Worker

## 2023-05-15 ENCOUNTER — Encounter (HOSPITAL_COMMUNITY): Payer: Self-pay | Admitting: Licensed Clinical Social Worker

## 2023-07-10 ENCOUNTER — Other Ambulatory Visit (HOSPITAL_COMMUNITY): Payer: Self-pay | Admitting: Family

## 2023-07-14 ENCOUNTER — Telehealth (HOSPITAL_COMMUNITY): Payer: 59 | Admitting: Family

## 2023-07-14 DIAGNOSIS — F411 Generalized anxiety disorder: Secondary | ICD-10-CM | POA: Diagnosis not present

## 2023-07-14 DIAGNOSIS — F1020 Alcohol dependence, uncomplicated: Secondary | ICD-10-CM | POA: Diagnosis not present

## 2023-07-14 MED ORDER — ESCITALOPRAM OXALATE 20 MG PO TABS: 20.0000 mg | ORAL_TABLET | Freq: Every day | ORAL | 1 refills | Status: DC

## 2023-07-14 MED ORDER — HYDROXYZINE HCL 25 MG PO TABS: 25.0000 mg | ORAL_TABLET | Freq: Three times a day (TID) | ORAL | 2 refills | Status: DC | PRN

## 2023-07-14 NOTE — Progress Notes (Signed)
 [facility] MD/PA/NP OP Progress Note  07/14/2023 11:21 AM Victoria Nash  MRN:  [id number]  Chief Complaint: [name] stated  I am feeling and doing great.   HPI: Victoria Nash 43 year old Caucasian female presents for medication management follow-up appointment.  She is currently prescribed Lexapro  20 mg daily and hydroxyzine  25 mg p.o. 3 times daily as needed which she reports she has been taking and tolerating well.  Denying any medication side effects at this time.  She reports a good appetite.  States she is resting well throughout the night.  Victoria Nash reports she is has plans to make follow-up appointment with therapist Zell Console for therapy services.  States due to the holidays she has not been able to follow-up as she would like to however, stated she feels like her mood has stabilized.  Rating her depression anxiety 2 out of 10 with 10 being the worst.  Denied additional concerns related to substance abuse/misuse during this assessment.  Discussed following up 3 months for medication management. Patient is requesting a refill with her hydroxyzine . Support, encouragement and reassurance was provided.  Generalized anxiety disorder: Uncomplicated alcohol disorder:   Continue Lexapro  20 mg daily Continue hydroxyzine  25 mg p.o. 3 times daily  During evaluation Victoria Nash is sitting at home, stated  I am working form home due to the weather. she is alert/oriented x 4; calm/cooperative; and mood congruent with affect.  Patient is speaking in a clear tone at moderate volume, and normal pace; with good eye contact.  Her thought process is coherent and relevant; There is no indication that she is currently responding to internal/external stimuli or experiencing delusional thought content.  Patient denies suicidal/self-harm/homicidal ideation, psychosis, and paranoia.  Patient has remained calm throughout assessment and has answered questions appropriately.    Visit Diagnosis:    ICD-10-CM   1. GAD  (generalized anxiety disorder)  F41.1     2. Uncomplicated alcohol dependence (HCC)  F10.20       Past Psychiatric History:   Past Medical History:  Past Medical History:  Diagnosis Date   Anxiety    No past surgical history on file.  Family Psychiatric History:   Family History:  Family History  Problem Relation Age of Onset   Hypertension Father    Cancer Father        oral cancer   Leukemia Father        lymphocytic   Dementia Father    Lung cancer Maternal Grandmother     Social History:  Social History   Socioeconomic History   Marital status: Married    Spouse name: Not on file   Number of children: Not on file   Years of education: Not on file   Highest education level: Not on file  Occupational History   Not on file  Tobacco Use   Smoking status: Never   Smokeless tobacco: Never  Vaping Use   Vaping status: Never Used  Substance and Sexual Activity   Alcohol use: Not Currently   Drug use: No   Sexual activity: Yes    Partners: Male    Birth control/protection: Other-see comments    Comment: vasectomy  Other Topics Concern   Not on file  Social History Narrative   Not on file   Social Drivers of Health   Financial Resource Strain: Not on file  Food Insecurity: No Food Insecurity (03/30/2023)   Hunger Vital Sign    Worried About Running Out of Food in the Last  Year: Never true    Ran Out of Food in the Last Year: Never true  Transportation Needs: No Transportation Needs (03/30/2023)   PRAPARE - Administrator, Civil Service (Medical): No    Lack of Transportation (Non-Medical): No  Physical Activity: Not on file  Stress: Not on file  Social Connections: Not on file    Allergies: No Known Allergies  Metabolic Disorder Labs: Lab Results  Component Value Date   HGBA1C 5.1 03/30/2023   MPG 99.67 03/30/2023   Lab Results  Component Value Date   PROLACTIN 12.6 05/17/2020   Lab Results  Component Value Date   CHOL 217 (H)  03/30/2023   TRIG 41 03/30/2023   HDL 89 03/30/2023   CHOLHDL 2.4 03/30/2023   VLDL 8 03/30/2023   LDLCALC 120 (H) 03/30/2023   LDLCALC 91 08/13/2021   Lab Results  Component Value Date   TSH 0.823 03/30/2023   TSH 2.638 03/12/2023    Therapeutic Level Labs: No results found for: LITHIUM No results found for: VALPROATE No results found for: CBMZ  Current Medications: Current Outpatient Medications  Medication Sig Dispense Refill   Calcium-Magnesium -Vitamin D  (CALCIUM 500 PO) Take 1 tablet by mouth daily.     escitalopram  (LEXAPRO ) 20 MG tablet Take 1 tablet (20 mg total) by mouth daily. 90 tablet 1   hydrOXYzine  (ATARAX ) 25 MG tablet Take 1 tablet (25 mg total) by mouth 3 (three) times daily as needed for anxiety. 90 tablet 2   Multiple Vitamins-Minerals (MULTI COMPLETE PO) Take 1 tablet by mouth daily.     tretinoin (ALTRALIN) 0.05 % gel Apply 1 Application topically at bedtime.     No current facility-administered medications for this visit.     Musculoskeletal: Tele-assessment   Psychiatric Specialty Exam: Review of Systems  Psychiatric/Behavioral:  Negative for decreased concentration, sleep disturbance and suicidal ideas. The patient is nervous/anxious.   All other systems reviewed and are negative.   There were no vitals taken for this visit.There is no height or weight on file to calculate BMI.  General Appearance: Casual  Eye Contact:  Good  Speech:  Clear and Coherent  Volume:  Normal  Mood:  Anxious and Depressed  Affect:  Appropriate and Congruent  Thought Process:  Coherent  Orientation:  Full (Time, Place, and Person)  Thought Content: Logical   Suicidal Thoughts:  No  Homicidal Thoughts:  No  Memory:  Immediate;   Good Recent;   Good  Judgement:  Good  Insight:  Good  Psychomotor Activity:  Normal  Concentration:  Concentration: Good  Recall:  Good  Fund of Knowledge: Good  Language: Good  Akathisia:  No  Handed:  Right  AIMS (if  indicated): done  Assets:  Communication Skills Desire for Improvement Resilience Social Support  ADL's:  Intact  Cognition: WNL  Sleep:  Good   Screenings: AUDIT    Flowsheet Row ED from 03/30/2023 in [facility]  Alcohol Use Disorder Identification Test Final Score (AUDIT) 19      PHQ2-9    Flowsheet Row Office Visit [facility]/07/2023 [facility] ED from 03/30/2023 in [facility]  PHQ-2 Total Score 5 2  PHQ-9 Total Score 13 17      Flowsheet Row Office Visit from 04/07/2023 in [facility] Most recent reading at 04/07/2023 12:20 PM ED from 03/30/2023 in [facility] Most recent reading at 03/30/2023  2:25 PM ED from 03/30/2023 in [facility] Most recent reading at 03/30/2023  7:23 AM  C-SSRS RISK CATEGORY No Risk No Risk No Risk        Assessment and Plan: Victoria Nash 43 year old Caucasian female presents for medication management appointment.  She is currently prescribed Lexapro  and hydroxyzine  which she reports taking and tolerating well.  States since previous visit her mood has improved and stabilized she has been compliant with medication no medication side effects.  She reports a good appetite.  States she is resting well throughout the night.  She reports plans to follow-up with therapist but did not next month.  No concerns related to alcohol cravings or withdrawal symptoms.  Reports she had a wonderful holiday with family and friends.  Patient to follow-up 3 months for medication management appointment.  Support, encouragement and  reassurance was provided.   Generalized anxiety disorder: Uncomplicated alcohol disorder:   Continue Lexapro  20 mg daily Continue hydroxyzine  25 mg p.o. 3 times daily F/U 3 months   Collaboration of Care: Collaboration of Care: Medication Management  AEB continue medication as directed  Patient/Guardian was advised Release of Information must be obtained prior to any record release in order to collaborate their care with an outside provider. Patient/Guardian was advised if they have not already done so to contact the registration department to sign all necessary forms in order for us  to release information regarding their care.   Consent: Patient/Guardian gives verbal consent for treatment and assignment of benefits for services provided during this visit. Patient/Guardian expressed understanding and agreed to proceed.    Staci LOISE Kerns, NP 07/14/2023, 11:21 AM

## 2023-07-23 NOTE — Progress Notes (Signed)
 Virtual Visit via Video Note  I connected with Victoria Nash on 07/14/2023 at 11:30 AM EST by a video enabled telemedicine application and verified that I am speaking with the correct person using two identifiers.  Location: Patient: Home Provider: Office    I discussed the limitations of evaluation and management by telemedicine and the availability of in person appointments. The patient expressed understanding and agreed to proceed.   I discussed the assessment and treatment plan with the patient. The patient was provided an opportunity to ask questions and all were answered. The patient agreed with the plan and demonstrated an understanding of the instructions.   The patient was advised to call back or seek an in-person evaluation if the symptoms worsen or if the condition fails to improve as anticipated.  I provided 15 minutes of non-face-to-face time during this encounter.   Staci LOISE Kerns, NP  [facility] MD/PA/NP OP Progress Note  07/23/2023 9:51 AM Victoria Nash  MRN:  [id number]  Chief Complaint: Victoria Nash stated  I am feeling and doing great.   HPI: Victoria Nash 43 year old Caucasian female presents for medication management follow-up appointment.  She is currently prescribed Lexapro  20 mg daily and hydroxyzine  25 mg p.o. 3 times daily as needed which she reports she has been taking and tolerating well.  Denying any medication side effects at this time.  She reports a good appetite.  States she is resting well throughout the night.  Victoria Nash reports she is has plans to make follow-up appointment with therapist Victoria Nash for therapy services.  States due to the holidays she has not been able to follow-up as she would like to however, stated she feels like her mood has stabilized.  Rating her depression anxiety 2 out of 10 with 10 being the worst.  Denied additional concerns related to substance abuse/misuse during this assessment.  Discussed following up 3 months for medication  management. Patient is requesting a refill with her hydroxyzine . Support, encouragement and reassurance was provided.  Generalized anxiety disorder: Uncomplicated alcohol disorder:   Continue Lexapro  20 mg daily Continue hydroxyzine  25 mg p.o. 3 times daily  During evaluation Victoria Nash is sitting at home, stated  I am working form home due to the weather. she is alert/oriented x 4; calm/cooperative; and mood congruent with affect.  Patient is speaking in a clear tone at moderate volume, and normal pace; with good eye contact.  Her thought process is coherent and relevant; There is no indication that she is currently responding to internal/external stimuli or experiencing delusional thought content.  Patient denies suicidal/self-harm/homicidal ideation, psychosis, and paranoia.  Patient has remained calm throughout assessment and has answered questions appropriately.    Visit Diagnosis:    ICD-10-CM   1. GAD (generalized anxiety disorder)  F41.1     2. Uncomplicated alcohol dependence (HCC)  F10.20       Past Psychiatric History:   Past Medical History:  Past Medical History:  Diagnosis Date   Anxiety    No past surgical history on file.  Family Psychiatric History:   Family History:  Family History  Problem Relation Age of Onset   Hypertension Father    Cancer Father        oral cancer   Leukemia Father        lymphocytic   Dementia Father    Lung cancer Maternal Grandmother     Social History:  Social History   Socioeconomic History   Marital status: Married  Spouse name: Not on file   Number of children: Not on file   Years of education: Not on file   Highest education level: Not on file  Occupational History   Not on file  Tobacco Use   Smoking status: Never   Smokeless tobacco: Never  Vaping Use   Vaping status: Never Used  Substance and Sexual Activity   Alcohol use: Not Currently   Drug use: No   Sexual activity: Yes    Partners: Male     Birth control/protection: Other-see comments    Comment: vasectomy  Other Topics Concern   Not on file  Social History Narrative   Not on file   Social Drivers of Health   Financial Resource Strain: Not on file  Food Insecurity: No Food Insecurity (03/30/2023)   Hunger Vital Sign    Worried About Running Out of Food in the Last Year: Never true    Ran Out of Food in the Last Year: Never true  Transportation Needs: No Transportation Needs (03/30/2023)   PRAPARE - Transportation    Lack of Transportation (Medical): No    Lack of Transportation (Non-Medical): No  Physical Activity: Not on file  Stress: Not on file  Social Connections: Not on file    Allergies: No Known Allergies  Metabolic Disorder Labs: Lab Results  Component Value Date   HGBA1C 5.1 03/30/2023   MPG 99.67 03/30/2023   Lab Results  Component Value Date   PROLACTIN 12.6 05/17/2020   Lab Results  Component Value Date   CHOL 217 (H) 03/30/2023   TRIG 41 03/30/2023   HDL 89 03/30/2023   CHOLHDL 2.4 03/30/2023   VLDL 8 03/30/2023   LDLCALC 120 (H) 03/30/2023   LDLCALC 91 08/13/2021   Lab Results  Component Value Date   TSH 0.823 03/30/2023   TSH 2.638 03/12/2023    Therapeutic Level Labs: No results found for: LITHIUM No results found for: VALPROATE No results found for: CBMZ  Current Medications: Current Outpatient Medications  Medication Sig Dispense Refill   Calcium-Magnesium -Vitamin D  (CALCIUM 500 PO) Take 1 tablet by mouth daily.     escitalopram  (LEXAPRO ) 20 MG tablet Take 1 tablet (20 mg total) by mouth daily. 90 tablet 1   hydrOXYzine  (ATARAX ) 25 MG tablet Take 1 tablet (25 mg total) by mouth 3 (three) times daily as needed for anxiety. 90 tablet 2   Multiple Vitamins-Minerals (MULTI COMPLETE PO) Take 1 tablet by mouth daily.     tretinoin (ALTRALIN) 0.05 % gel Apply 1 Application topically at bedtime.     No current facility-administered medications for this visit.      Musculoskeletal: Tele-assessment   Psychiatric Specialty Exam: Review of Systems  Psychiatric/Behavioral:  Negative for decreased concentration, sleep disturbance and suicidal ideas. The patient is nervous/anxious.   All other systems reviewed and are negative.   There were no vitals taken for this visit.There is no height or weight on file to calculate BMI.  General Appearance: Casual  Eye Contact:  Good  Speech:  Clear and Coherent  Volume:  Normal  Mood:  Anxious and Depressed  Affect:  Appropriate and Congruent  Thought Process:  Coherent  Orientation:  Full (Time, Place, and Person)  Thought Content: Logical   Suicidal Thoughts:  No  Homicidal Thoughts:  No  Memory:  Immediate;   Good Recent;   Good  Judgement:  Good  Insight:  Good  Psychomotor Activity:  Normal  Concentration:  Concentration: Good  Recall:  Good  Fund of Knowledge: Good  Language: Good  Akathisia:  No  Handed:  Right  AIMS (if indicated): done  Assets:  Communication Skills Desire for Improvement Resilience Social Support  ADL's:  Intact  Cognition: WNL  Sleep:  Good   Screenings: AUDIT    Flowsheet Row ED from 03/30/2023 in [facility]  Alcohol Use Disorder Identification Test Final Score (AUDIT) 19      PHQ2-9    Flowsheet Row Office Visit from 04/07/2023 in [facility] from 03/30/2023 in [facility]  PHQ-2 Total Score 5 2  PHQ-9 Total Score 13 17      Flowsheet Row Office Visit from 04/07/2023 in [facility] Most recent reading at 04/07/2023 12:20 PM ED from 03/30/2023 in [facility] Most recent reading at 03/30/2023  2:25 PM ED from 03/30/2023 in [facility] Most recent reading at 03/30/2023  7:23 AM  C-SSRS RISK CATEGORY No Risk No Risk No Risk        Assessment and Plan: Eve Rey 43 year old Caucasian female presents for medication management appointment.  She is currently prescribed Lexapro  and hydroxyzine  which she reports taking and tolerating well.  States since previous visit her mood has improved and stabilized she has been compliant with medication no medication side effects.  She reports a good appetite.  States she is resting well throughout the night.  She reports plans to follow-up with therapist but did not next month.  No concerns related to alcohol cravings or withdrawal symptoms.  Reports she had a wonderful holiday with family and friends.  Patient to follow-up 3 months for medication management appointment.  Support, encouragement and  reassurance was provided.   Generalized anxiety disorder: Uncomplicated alcohol disorder:   Continue Lexapro  20 mg daily Continue hydroxyzine  25 mg p.o. 3 times daily F/U 3 months   Collaboration of Care: Collaboration of Care: Medication Management AEB continue medication as directed  Patient/Guardian was advised Release of Information must be obtained prior to any record release in order to collaborate their care with an outside provider. Patient/Guardian was advised if they have not already done so to contact the registration department to sign all necessary forms in order for us  to release information regarding their care.   Consent: Patient/Guardian gives verbal consent for treatment and assignment of benefits for services provided during this visit. Patient/Guardian expressed understanding and agreed to proceed.    Staci LOISE Kerns, NP 07/14/2023, 9:51 AM

## 2023-08-12 NOTE — Progress Notes (Signed)
43 y.o. G48P2002 Married Caucasian female here for annual exam.    No period problems.    Desires flu vaccine.   Dr. Melvyn Neth prescribing Lexapro now.  PCP: Pcp, No  Dr. Melvyn Neth.  Behavioral [facility].   Patient's last menstrual period was 08/14/2023.     Period Cycle (Days): 28 Period Duration (Days): 4-5 Period Pattern: Regular Menstrual Flow: Light Menstrual Control: Maxi pad Dysmenorrhea: None     Sexually active: Yes.    The current method of family planning is vasectomy.    Menopausal hormone therapy:  n/a Exercising: Yes.     Strength training/cycle classes O2 Smoker:  no  OB History  Gravida Para Term Preterm AB Living  2 2 2   2   SAB IAB Ectopic Multiple Live Births          # Outcome Date GA Lbr Len/2nd Weight Sex Type Anes PTL Lv  2 Term           1 Term              HEALTH MAINTENANCE: Last 2 paps:  01-23-18 Neg:Neg HR HPV, 07-19-15 Neg, 06-14-13 Neg:Neg HR HPV  History of abnormal Pap or positive HPV:  no Mammogram:   n/a Colonoscopy:  n/a Bone Density:  n/a  Result  n/a   Immunization History  Administered Date(s) Administered   HPV 9-valent 05/17/2020, 07/18/2020, 11/16/2020   Influenza,inj,Quad PF,6+ Mos 05/08/2018, 08/13/2021   Tdap 06/14/2013      reports that she has never smoked. She has never used smokeless tobacco. She reports that she does not currently use alcohol. She reports that she does not use drugs.  Past Medical History:  Diagnosis Date   Anxiety     History reviewed. No pertinent surgical history.  Current Outpatient Medications  Medication Sig Dispense Refill   Calcium-Magnesium-Vitamin D (CALCIUM 500 PO) Take 1 tablet by mouth daily.     escitalopram (LEXAPRO) 20 MG tablet Take 1 tablet (20 mg total) by mouth daily. 90 tablet 1   hydrOXYzine (ATARAX) 25 MG tablet Take 1 tablet (25 mg total) by mouth 3 (three) times daily as needed for anxiety. 90 tablet 2   Multiple Vitamins-Minerals (MULTI COMPLETE PO) Take 1 tablet by mouth  daily.     tretinoin (ALTRALIN) 0.05 % gel Apply 1 Application topically at bedtime.     No current facility-administered medications for this visit.    ALLERGIES: Patient has no known allergies.  Family History  Problem Relation Age of Onset   Hypertension Father    Cancer Father        oral cancer   Leukemia Father        lymphocytic   Dementia Father    Lung cancer Maternal Grandmother     Review of Systems  All other systems reviewed and are negative.   PHYSICAL EXAM:  BP 130/86 (BP Location: Left Arm, Patient Position: Sitting, Cuff Size: Small)   Pulse 97   Ht 5' 7.5" (1.715 m)   Wt 191 lb (86.6 kg)   LMP 08/14/2023   SpO2 98%   BMI 29.47 kg/m     General appearance: alert, cooperative and appears stated age Head: normocephalic, without obvious abnormality, atraumatic Neck: no adenopathy, supple, symmetrical, trachea midline and thyroid normal to inspection and palpation Lungs: clear to auscultation bilaterally Breasts: normal appearance, no masses or tenderness, No nipple retraction or dimpling, No nipple discharge or bleeding, No axillary adenopathy Heart: regular rate and rhythm Abdomen: soft,  non-tender; no masses, no organomegaly Extremities: extremities normal, atraumatic, no cyanosis or edema Skin: skin color, texture, turgor normal. No rashes or lesions Lymph nodes: cervical, supraclavicular, and axillary nodes normal. Neurologic: grossly normal  Pelvic: External genitalia:  no lesions              No abnormal inguinal nodes palpated.              Urethra:  normal appearing urethra with no masses, tenderness or lesions              Bartholins and Skenes: normal                 Vagina: normal appearing vagina with normal color and discharge, no lesions              Cervix: no lesions              Pap taken: yes Bimanual Exam:  Uterus:  normal size, contour, position, consistency, mobility, non-tender              Adnexa: no mass, fullness, tenderness               Rectal exam: yes.  Confirms.              Anus:  normal sphincter tone, no lesions  Chaperone was present for exam:  Antony Salmon, CMA  ASSESSMENT: Well woman visit with gynecologic exam Cervical cancer screening.  Anxiety.  Controlled on Lexapro. PHQ2: 0  PLAN: Mammogram screening discussed.  Patient given names and contact information for mammogram facilities in [place].  Self breast awareness reviewed. Pap and HRV collected:  Yes.   Guidelines for Calcium, Vitamin D, regular exercise program including cardiovascular and weight bearing exercise. Medication refills:  NA Flu vaccine today.  Follow up:  yearly and prn.

## 2023-08-26 ENCOUNTER — Encounter: Payer: Self-pay | Admitting: Obstetrics and Gynecology

## 2023-08-26 ENCOUNTER — Ambulatory Visit (INDEPENDENT_AMBULATORY_CARE_PROVIDER_SITE_OTHER): Payer: 59 | Admitting: Obstetrics and Gynecology

## 2023-08-26 ENCOUNTER — Other Ambulatory Visit: Payer: Self-pay | Admitting: Obstetrics and Gynecology

## 2023-08-26 ENCOUNTER — Other Ambulatory Visit (HOSPITAL_COMMUNITY)
Admission: RE | Admit: 2023-08-26 | Discharge: 2023-08-26 | Disposition: A | Discharge status: Home / Self Care | Payer: 59 | Source: Ambulatory Visit | Attending: Obstetrics and Gynecology | Admitting: Obstetrics and Gynecology

## 2023-08-26 VITALS — BP 130/86 | HR 97 | Ht 67.5 in | Wt 191.0 lb

## 2023-08-26 DIAGNOSIS — Z124 Encounter for screening for malignant neoplasm of cervix: Secondary | ICD-10-CM

## 2023-08-26 DIAGNOSIS — Z1231 Encounter for screening mammogram for malignant neoplasm of breast: Secondary | ICD-10-CM

## 2023-08-26 DIAGNOSIS — Z23 Encounter for immunization: Secondary | ICD-10-CM

## 2023-08-26 DIAGNOSIS — Z1331 Encounter for screening for depression: Secondary | ICD-10-CM

## 2023-08-26 DIAGNOSIS — Z01419 Encounter for gynecological examination (general) (routine) without abnormal findings: Secondary | ICD-10-CM | POA: Diagnosis not present

## 2023-08-26 NOTE — Patient Instructions (Signed)

## 2023-08-28 LAB — CYTOLOGY - PAP
Comment: NEGATIVE
Diagnosis: NEGATIVE
High risk HPV: NEGATIVE

## 2023-08-29 ENCOUNTER — Encounter: Payer: Self-pay | Admitting: Obstetrics and Gynecology

## 2023-09-03 ENCOUNTER — Ambulatory Visit
Admission: RE | Admit: 2023-09-03 | Discharge: 2023-09-03 | Disposition: A | Discharge status: Home / Self Care | Payer: Self-pay | Source: Ambulatory Visit | Attending: Obstetrics and Gynecology | Admitting: Obstetrics and Gynecology

## 2023-09-03 DIAGNOSIS — Z1231 Encounter for screening mammogram for malignant neoplasm of breast: Secondary | ICD-10-CM

## 2023-09-08 ENCOUNTER — Encounter: Payer: Self-pay | Admitting: Obstetrics and Gynecology

## 2023-10-13 ENCOUNTER — Telehealth (HOSPITAL_BASED_OUTPATIENT_CLINIC_OR_DEPARTMENT_OTHER): Payer: 59 | Admitting: Family

## 2023-10-13 DIAGNOSIS — F411 Generalized anxiety disorder: Secondary | ICD-10-CM

## 2023-10-13 DIAGNOSIS — F1020 Alcohol dependence, uncomplicated: Secondary | ICD-10-CM | POA: Diagnosis not present

## 2023-10-13 MED ORDER — ESCITALOPRAM OXALATE 20 MG PO TABS: 20.0000 mg | ORAL_TABLET | Freq: Every day | ORAL | 1 refills | Status: AC

## 2023-10-13 MED ORDER — HYDROXYZINE HCL 25 MG PO TABS: 25.0000 mg | ORAL_TABLET | Freq: Three times a day (TID) | ORAL | 2 refills | Status: AC | PRN

## 2023-10-13 NOTE — Progress Notes (Signed)
 Virtual Visit via Video Note  I connected with Victoria Nash on 10/13/23 at 11:30 AM EDT by a video enabled telemedicine application and verified that I am speaking with the correct person using two identifiers.  Location: Patient: Home  Provider: Office    I discussed the limitations of evaluation and management by telemedicine and the availability of in person appointments. The patient expressed understanding and agreed to proceed.  I discussed the assessment and treatment plan with the patient. The patient was provided an opportunity to ask questions and all were answered. The patient agreed with the plan and demonstrated an understanding of the instructions.   The patient was advised to call back or seek an in-person evaluation if the symptoms worsen or if the condition fails to improve as anticipated.  I provided 14 minutes of non-face-to-face time during this encounter.   Victoria Rack, NP   BH MD/PA/NP OP Progress Note  10/13/2023 12:05 PM Victoria Nash  MRN:  [id number]  Chief Complaint: Medication management  HPI: Victoria Nash 43 year old female presents for medication management follow-up appointment.  She was seen and evaluated via care agility virtual platform.  Carries a diagnosis related to generalized anxiety disorder, uncomplicated withdrawal dependency.  She is currently prescribed Lexapro and hydroxyzine for mood stabilization.  States she has been taking and tolerating medications well.  States she feels her mood has stabilized.  No new concerns documented at this visit.  Reports she has been following a life coach through [employer].  States she has a sponsor and feels that her life is getting back on track.  Denied any alcohol cravings.    Denies that she needs therapy services at this time.  She reports a good appetite.  States she is resting well throughout the night.  Patient to continue current medication regimen.  Follow-up 5 months.  She was receptive to plan.   Support encouragement reassurance was provided.  Continue Lexapro 20 mg p.o. daily -Continue hydroxyzine 25 mg p.o. nightly as needed   Victoria Nash is sitting she is alert/oriented x 3; calm/cooperative; and mood congruent with affect.  Patient is speaking in a clear tone at moderate volume, and normal pace; with good eye contact.    Her thought process is coherent and relevant; There is no indication that she is currently responding to internal/external stimuli or experiencing delusional thought content.  Patient denies suicidal/self-harm/homicidal ideation, psychosis, and paranoia.  Patient has remained calm throughout assessment and has answered questions appropriately.    Visit Diagnosis:    ICD-10-CM   1. GAD (generalized anxiety disorder)  F41.1     2. Uncomplicated alcohol dependence (HCC)  F10.20       Past Psychiatric History:  see chart  Past Medical History:  Past Medical History:  Diagnosis Date   Anxiety    No past surgical history on file.  Family Psychiatric History:   Family History:  Family History  Problem Relation Age of Onset   Hypertension Father    Cancer Father        oral cancer   Leukemia Father        lymphocytic   Dementia Father    Lung cancer Maternal Grandmother     Social History:  Social History   Socioeconomic History   Marital status: Married    Spouse name: Not on file   Number of children: Not on file   Years of education: Not on file   Highest education level: Not on  file  Occupational History   Not on file  Tobacco Use   Smoking status: Never   Smokeless tobacco: Never  Vaping Use   Vaping status: Never Used  Substance and Sexual Activity   Alcohol use: Not Currently   Drug use: No   Sexual activity: Yes    Partners: Male    Birth control/protection: Other-see comments    Comment: vasectomy  Other Topics Concern   Not on file  Social History Narrative   Not on file   Social Drivers of Health   Financial  Resource Strain: Not on file  Food Insecurity: No Food Insecurity (03/30/2023)   Hunger Vital Sign    Worried About Running Out of Food in the Last Year: Never true    Ran Out of Food in the Last Year: Never true  Transportation Needs: No Transportation Needs (03/30/2023)   PRAPARE - Administrator, Civil Service (Medical): No    Lack of Transportation (Non-Medical): No  Physical Activity: Not on file  Stress: Not on file  Social Connections: Not on file    Allergies: No Known Allergies  Metabolic Disorder Labs: Lab Results  Component Value Date   HGBA1C 5.1 03/30/2023   MPG 99.67 03/30/2023   Lab Results  Component Value Date   PROLACTIN 12.6 05/17/2020   Lab Results  Component Value Date   CHOL 217 (H) 03/30/2023   TRIG 41 03/30/2023   HDL 89 03/30/2023   CHOLHDL 2.4 03/30/2023   VLDL 8 03/30/2023   LDLCALC 120 (H) 03/30/2023   LDLCALC 91 08/13/2021   Lab Results  Component Value Date   TSH 0.823 03/30/2023   TSH 2.638 03/12/2023    Therapeutic Level Labs: No results found for: "LITHIUM" No results found for: "VALPROATE" No results found for: "CBMZ"  Current Medications: Current Outpatient Medications  Medication Sig Dispense Refill   Calcium-Magnesium-Vitamin D (CALCIUM 500 PO) Take 1 tablet by mouth daily.     escitalopram (LEXAPRO) 20 MG tablet Take 1 tablet (20 mg total) by mouth daily. 90 tablet 1   hydrOXYzine (ATARAX) 25 MG tablet Take 1 tablet (25 mg total) by mouth 3 (three) times daily as needed for anxiety. 90 tablet 2   Multiple Vitamins-Minerals (MULTI COMPLETE PO) Take 1 tablet by mouth daily.     tretinoin (ALTRALIN) 0.05 % gel Apply 1 Application topically at bedtime.     No current facility-administered medications for this visit.     Musculoskeletal: Virtual platform,  Psychiatric Specialty Exam: Review of Systems  Psychiatric/Behavioral:  Negative for dysphoric mood and hallucinations. Suicidal ideas:  improved.Nervous/anxious: improving.     There were no vitals taken for this visit.There is no height or weight on file to calculate BMI.  General Appearance: Casual  Eye Contact:  Good  Speech:  Clear and Coherent  Volume:  Normal  Mood:  Anxious and Depressed  Affect:  Congruent  Thought Process:  Coherent  Orientation:  Full (Time, Place, and Person)  Thought Content: Logical   Suicidal Thoughts:  No  Homicidal Thoughts:  No  Memory:  Immediate;   Good Recent;   Good  Judgement:  Good  Insight:  Good  Psychomotor Activity:  Normal  Concentration:  Concentration: Good  Recall:  Good  Fund of Knowledge: Good  Language: Good  Akathisia:  No  Handed:  Right  AIMS (if indicated): not done  Assets:  Communication Skills Desire for Improvement Resilience Social Support  ADL's:  Intact  Cognition:  WNL  Sleep:  Good   Screenings: AUDIT    Flowsheet Row ED from 03/30/2023 in [facility]  Alcohol Use Disorder Identification Test Final Score (AUDIT) 19      PHQ2-9    Flowsheet Row Office Visit from 08/26/2023 in [facility] [place] Office Visit from 04/07/2023 in [facility] ED from 03/30/2023 in [facility]  PHQ-2 Total Score 0 5 2  PHQ-9 Total Score -- 13 17      Flowsheet Row Office Visit from 04/07/2023 in [facility] Most recent reading at 04/07/2023 12:20 PM ED from 03/30/2023 in [facility] Most recent reading at 03/30/2023  2:25 PM ED from 03/30/2023 in [facility] Most recent reading at 03/30/2023  7:23 AM  C-SSRS RISK CATEGORY No Risk No Risk No Risk        Assessment and Plan: Victoria Nash 43 year old female presents for medication management follow-up appointment.  Reports overall her mood has stabilized.  States she has been followed by [employer]  sponsor.  States her depression and anxiety has improved.  Denied that she needs therapy services at this time.  States she has been taking her medications as directed.  No documented side effects.  Discussed following up 4 to 5 months for medication management.  She was receptive to plan.  Support, encouragement and  reassurance was provided.  Collaboration of Care: Collaboration of Care: Medication Management AEB continue current medication regimen Lexapro 20 mg and hydroxyzine 25 mg as needed  Patient/Guardian was advised Release of Information must be obtained prior to any record release in order to collaborate their care with an outside provider. Patient/Guardian was advised if they have not already done so to contact the registration department to sign all necessary forms in order for [place] to release information regarding their care.   Consent: Patient/Guardian gives verbal consent for treatment and assignment of benefits for services provided during this visit. Patient/Guardian expressed understanding and agreed to proceed.    Victoria Rack, NP 10/13/2023, 12:05 PM

## 2023-10-13 NOTE — Addendum Note (Signed)
 Addended by: Oneta Rack on: 10/13/2023 12:11 PM   Modules accepted: Level of Service

## 2024-04-03 ENCOUNTER — Other Ambulatory Visit (HOSPITAL_COMMUNITY): Payer: Self-pay | Admitting: Family

## 2024-05-08 ENCOUNTER — Other Ambulatory Visit (HOSPITAL_COMMUNITY): Payer: Self-pay | Admitting: Family

## 2024-07-20 ENCOUNTER — Other Ambulatory Visit: Payer: Self-pay | Admitting: Obstetrics and Gynecology

## 2024-07-20 DIAGNOSIS — Z1231 Encounter for screening mammogram for malignant neoplasm of breast: Secondary | ICD-10-CM

## 2024-08-30 ENCOUNTER — Ambulatory Visit: Payer: 59 | Admitting: Obstetrics and Gynecology

## 2024-09-08 ENCOUNTER — Ambulatory Visit

## 2024-09-08 ENCOUNTER — Ambulatory Visit: Admitting: Obstetrics and Gynecology

## 2024-12-20 ENCOUNTER — Ambulatory Visit: Admitting: Physician Assistant

## 2025-01-11 ENCOUNTER — Ambulatory Visit: Admitting: Physician Assistant
